# Patient Record
Sex: Male | Born: 1965 | Race: White | Hispanic: No | Marital: Married | Smoking: Never smoker
Health system: Southern US, Community
[De-identification: ages and names within clinical notes are randomized; demographics above are authoritative.]

## PROBLEM LIST (undated history)

## (undated) DIAGNOSIS — E785 Hyperlipidemia, unspecified: Secondary | ICD-10-CM

## (undated) HISTORY — PX: OTHER SURGICAL HISTORY: SHX169

## (undated) HISTORY — PX: CLOSED REDUCTION CLAVICLE FRACTURE: SUR253

---

## 2000-08-05 ENCOUNTER — Emergency Department (HOSPITAL_COMMUNITY): Admission: EM | Admit: 2000-08-05 | Discharge: 2000-08-05 | Payer: Self-pay | Admitting: Emergency Medicine

## 2000-08-05 ENCOUNTER — Encounter: Payer: Self-pay | Admitting: Orthopedic Surgery

## 2000-08-14 ENCOUNTER — Encounter: Payer: Self-pay | Admitting: Orthopedic Surgery

## 2000-08-14 ENCOUNTER — Ambulatory Visit (HOSPITAL_BASED_OUTPATIENT_CLINIC_OR_DEPARTMENT_OTHER): Admission: RE | Admit: 2000-08-14 | Discharge: 2000-08-14 | Payer: Self-pay | Admitting: Orthopedic Surgery

## 2014-10-12 ENCOUNTER — Ambulatory Visit (INDEPENDENT_AMBULATORY_CARE_PROVIDER_SITE_OTHER): Payer: 59 | Admitting: Podiatry

## 2014-10-12 ENCOUNTER — Ambulatory Visit (INDEPENDENT_AMBULATORY_CARE_PROVIDER_SITE_OTHER): Payer: 59

## 2014-10-12 VITALS — BP 148/100 | HR 61 | Resp 17

## 2014-10-12 DIAGNOSIS — R52 Pain, unspecified: Secondary | ICD-10-CM

## 2014-10-12 DIAGNOSIS — M722 Plantar fascial fibromatosis: Secondary | ICD-10-CM

## 2014-10-12 DIAGNOSIS — M779 Enthesopathy, unspecified: Secondary | ICD-10-CM

## 2014-10-12 MED ORDER — TRIAMCINOLONE ACETONIDE 10 MG/ML IJ SUSP
10.0000 mg | Freq: Once | INTRAMUSCULAR | Status: AC
  Administered 2014-10-12: 10 mg

## 2014-10-12 MED ORDER — MELOXICAM 15 MG PO TABS: 15.0000 mg | ORAL_TABLET | Freq: Every day | ORAL | Status: DC

## 2014-10-12 NOTE — Progress Notes (Signed)
Subjective:     Patient ID: Jeremy Winters, male   DOB: 1966/03/11, 48 y.o.   MRN: [id number]  HPI patient presents stating I'm very active and do [place] [facility] and then I am going: Skiing and I am having pain in my feet with the right one being quite a bit more inflamed and painful on the left one. Patient is very active   Review of Systems  All other systems reviewed and are negative.      Objective:   Physical Exam  Constitutional: He is oriented to person, place, and time.  Cardiovascular: Intact distal pulses.   Musculoskeletal: Normal range of motion.  Neurological: He is oriented to person, place, and time.  Skin: Skin is warm.  Nursing note and vitals reviewed.  neurovascular status is found to be intact with muscle strength adequate and range of motion subtalar and midtarsal joint within normal limits. Patient is noted to have equinus condition bilateral and is noted to have good digital perfusion and is well oriented 3. Patient does have a tight foot type and was noted to have discomfort in the medial arch right distal to the insertion into the calcaneus with mild discomfort also noted in the sinus tarsi when palpated     Assessment:     Probable low grade plantar fasciitis right causing compensatory symptoms in a very active individual    Plan:     H&P and x-rays reviewed and also discussed stretching exercises. Today I administered a very careful steroid injection into the mid arch area 3 mg dexamethasone Kenalog and 5 mg of Xylocaine. I did explain that this will put stress on the tendon and the possibility for rupture always exist and I want him to reduce activity and I also dispensed a fascially brace to wear. We will see him back again in approximately 4 weeks to see the results and how he did with his activities and he may need further treatment depending on how he responds. Also placed on mobility 15 mg daily

## 2014-10-12 NOTE — Progress Notes (Signed)
   Subjective:    Patient ID: Jeremy Winters, male    DOB: [birth year], 48 y.o.   MRN: [id number]  HPI  Pain in bilateral feet, worse in right foot. Burning/shooting pain, worsens with prolonged standing and walking  Review of Systems  All other systems reviewed and are negative.      Objective:   Physical Exam        Assessment & Plan:

## 2014-10-12 NOTE — Patient Instructions (Signed)

## 2014-11-09 ENCOUNTER — Ambulatory Visit (INDEPENDENT_AMBULATORY_CARE_PROVIDER_SITE_OTHER): Payer: 59 | Admitting: Podiatry

## 2014-11-09 VITALS — BP 126/88 | HR 66 | Resp 16

## 2014-11-09 DIAGNOSIS — M722 Plantar fascial fibromatosis: Secondary | ICD-10-CM

## 2014-11-09 NOTE — Progress Notes (Signed)
Subjective:     Patient ID: Jeremy Winters, male   DOB: 1965-12-28, 48 y.o.   MRN: [id number]  HPI patient presents stating I'm still having pain in my arch that doesn't seem to be as intense but it is more chronic in nature if I been very active. I was able to do a half marathon and do intense [place] skiing   Review of Systems     Objective:   Physical Exam Neurovascular status intact with moderate discomfort in the mid arch area right with no indications of fluid buildup or intense discomfort upon palpation    Assessment:     Chronic low grade plantar fasciitis right with an extremely active individual who develops symptoms secondary to his activity levels and activities he chooses to do    Plan:     Reviewed condition and at this time recommended night splint to try to help with stretch along with aggressive ice therapy and long-term orthotics. Patient is scanned for custom orthotics with instructions on their usage and will be seen back when they are ready and was given instructions on night splint usage and he will sleep with it at the current time

## 2014-12-04 ENCOUNTER — Ambulatory Visit: Payer: 59 | Admitting: *Deleted

## 2014-12-04 DIAGNOSIS — M722 Plantar fascial fibromatosis: Secondary | ICD-10-CM

## 2014-12-04 NOTE — Patient Instructions (Signed)

## 2014-12-04 NOTE — Progress Notes (Signed)
PICKING UP MY ORTHOTICS  

## 2015-01-04 ENCOUNTER — Ambulatory Visit: Payer: 59 | Admitting: Podiatry

## 2015-01-07 ENCOUNTER — Encounter: Payer: Self-pay | Admitting: Podiatry

## 2015-01-07 ENCOUNTER — Ambulatory Visit (INDEPENDENT_AMBULATORY_CARE_PROVIDER_SITE_OTHER): Payer: 59 | Admitting: Podiatry

## 2015-01-07 VITALS — BP 129/84 | HR 68 | Resp 16

## 2015-01-07 DIAGNOSIS — M722 Plantar fascial fibromatosis: Secondary | ICD-10-CM | POA: Diagnosis not present

## 2015-01-07 NOTE — Progress Notes (Signed)
Subjective:     Patient ID: Jeremy Winters, male   DOB: [birth year], 49 y.o.   MRN: [id number]  HPI patient states he still can get some pain in his arch right and heal but it is much less intense than previously and he is now running 50 miles a week and is due to run a marathon in the next 2 weeks. States that he does not know when it's can occur but at times it just appears whether running or doing nothing   Review of Systems     Objective:   Physical Exam Neurovascular status intact and was unable today to palpate any area of intense discomfort within the fascial band or fascially insertion right. There is no swelling or other area found that was abnormal    Assessment:     Low grade fasciitis with patient who is very active and may always have this kind of condition    Plan:     Reviewed aggressive physical therapy continued splinting activities and that ultimately this may require shockwave therapy. If symptoms were to get worse or other issues were to occur he is to reappoint for consideration of shockwave

## 2022-01-12 ENCOUNTER — Encounter (HOSPITAL_COMMUNITY): Payer: Self-pay

## 2022-01-12 ENCOUNTER — Inpatient Hospital Stay (HOSPITAL_COMMUNITY)
Admission: EM | Disposition: A | Discharge status: Home / Self Care | Payer: Self-pay | Source: Home / Self Care | Attending: Cardiothoracic Surgery

## 2022-01-12 ENCOUNTER — Emergency Department (HOSPITAL_COMMUNITY): Payer: 59

## 2022-01-12 ENCOUNTER — Other Ambulatory Visit: Payer: Self-pay

## 2022-01-12 ENCOUNTER — Inpatient Hospital Stay (HOSPITAL_COMMUNITY): Payer: 59

## 2022-01-12 ENCOUNTER — Inpatient Hospital Stay (HOSPITAL_COMMUNITY)
Admission: EM | Admit: 2022-01-12 | Discharge: 2022-01-26 | DRG: 233 | Disposition: A | Discharge status: Home / Self Care | Payer: 59 | Attending: Cardiothoracic Surgery | Admitting: Cardiothoracic Surgery

## 2022-01-12 DIAGNOSIS — Z01810 Encounter for preprocedural cardiovascular examination: Secondary | ICD-10-CM | POA: Diagnosis not present

## 2022-01-12 DIAGNOSIS — E8721 Acute metabolic acidosis: Secondary | ICD-10-CM | POA: Diagnosis present

## 2022-01-12 DIAGNOSIS — N289 Disorder of kidney and ureter, unspecified: Secondary | ICD-10-CM | POA: Diagnosis not present

## 2022-01-12 DIAGNOSIS — I493 Ventricular premature depolarization: Secondary | ICD-10-CM | POA: Diagnosis present

## 2022-01-12 DIAGNOSIS — J9811 Atelectasis: Secondary | ICD-10-CM | POA: Diagnosis present

## 2022-01-12 DIAGNOSIS — E871 Hypo-osmolality and hyponatremia: Secondary | ICD-10-CM | POA: Diagnosis not present

## 2022-01-12 DIAGNOSIS — G9340 Encephalopathy, unspecified: Secondary | ICD-10-CM | POA: Diagnosis present

## 2022-01-12 DIAGNOSIS — R079 Chest pain, unspecified: Secondary | ICD-10-CM

## 2022-01-12 DIAGNOSIS — Z978 Presence of other specified devices: Secondary | ICD-10-CM

## 2022-01-12 DIAGNOSIS — J9601 Acute respiratory failure with hypoxia: Secondary | ICD-10-CM | POA: Diagnosis present

## 2022-01-12 DIAGNOSIS — I2510 Atherosclerotic heart disease of native coronary artery without angina pectoris: Secondary | ICD-10-CM

## 2022-01-12 DIAGNOSIS — R4189 Other symptoms and signs involving cognitive functions and awareness: Secondary | ICD-10-CM

## 2022-01-12 DIAGNOSIS — I469 Cardiac arrest, cause unspecified: Secondary | ICD-10-CM

## 2022-01-12 DIAGNOSIS — J939 Pneumothorax, unspecified: Secondary | ICD-10-CM

## 2022-01-12 DIAGNOSIS — Z951 Presence of aortocoronary bypass graft: Secondary | ICD-10-CM

## 2022-01-12 DIAGNOSIS — E7800 Pure hypercholesterolemia, unspecified: Secondary | ICD-10-CM | POA: Diagnosis present

## 2022-01-12 DIAGNOSIS — R570 Cardiogenic shock: Secondary | ICD-10-CM | POA: Diagnosis present

## 2022-01-12 DIAGNOSIS — Z09 Encounter for follow-up examination after completed treatment for conditions other than malignant neoplasm: Secondary | ICD-10-CM

## 2022-01-12 DIAGNOSIS — D62 Acute posthemorrhagic anemia: Secondary | ICD-10-CM | POA: Diagnosis not present

## 2022-01-12 DIAGNOSIS — D72829 Elevated white blood cell count, unspecified: Secondary | ICD-10-CM | POA: Diagnosis present

## 2022-01-12 DIAGNOSIS — Z20822 Contact with and (suspected) exposure to covid-19: Secondary | ICD-10-CM | POA: Diagnosis present

## 2022-01-12 DIAGNOSIS — R451 Restlessness and agitation: Secondary | ICD-10-CM | POA: Diagnosis present

## 2022-01-12 DIAGNOSIS — J90 Pleural effusion, not elsewhere classified: Secondary | ICD-10-CM | POA: Diagnosis present

## 2022-01-12 DIAGNOSIS — E663 Overweight: Secondary | ICD-10-CM | POA: Diagnosis present

## 2022-01-12 DIAGNOSIS — Z9889 Other specified postprocedural states: Secondary | ICD-10-CM

## 2022-01-12 DIAGNOSIS — I214 Non-ST elevation (NSTEMI) myocardial infarction: Secondary | ICD-10-CM | POA: Diagnosis present

## 2022-01-12 DIAGNOSIS — I4901 Ventricular fibrillation: Secondary | ICD-10-CM | POA: Diagnosis present

## 2022-01-12 DIAGNOSIS — I252 Old myocardial infarction: Secondary | ICD-10-CM | POA: Diagnosis not present

## 2022-01-12 DIAGNOSIS — E785 Hyperlipidemia, unspecified: Secondary | ICD-10-CM

## 2022-01-12 DIAGNOSIS — E8770 Fluid overload, unspecified: Secondary | ICD-10-CM | POA: Diagnosis present

## 2022-01-12 DIAGNOSIS — I472 Ventricular tachycardia, unspecified: Secondary | ICD-10-CM | POA: Diagnosis present

## 2022-01-12 DIAGNOSIS — R739 Hyperglycemia, unspecified: Secondary | ICD-10-CM | POA: Diagnosis present

## 2022-01-12 DIAGNOSIS — N179 Acute kidney failure, unspecified: Secondary | ICD-10-CM | POA: Diagnosis present

## 2022-01-12 DIAGNOSIS — E876 Hypokalemia: Secondary | ICD-10-CM | POA: Diagnosis present

## 2022-01-12 DIAGNOSIS — D696 Thrombocytopenia, unspecified: Secondary | ICD-10-CM | POA: Diagnosis not present

## 2022-01-12 DIAGNOSIS — I10 Essential (primary) hypertension: Secondary | ICD-10-CM | POA: Diagnosis present

## 2022-01-12 DIAGNOSIS — Z8249 Family history of ischemic heart disease and other diseases of the circulatory system: Secondary | ICD-10-CM

## 2022-01-12 DIAGNOSIS — G4700 Insomnia, unspecified: Secondary | ICD-10-CM | POA: Diagnosis present

## 2022-01-12 DIAGNOSIS — I462 Cardiac arrest due to underlying cardiac condition: Secondary | ICD-10-CM | POA: Diagnosis present

## 2022-01-12 DIAGNOSIS — Z6828 Body mass index (BMI) 28.0-28.9, adult: Secondary | ICD-10-CM

## 2022-01-12 DIAGNOSIS — D649 Anemia, unspecified: Secondary | ICD-10-CM

## 2022-01-12 DIAGNOSIS — R7989 Other specified abnormal findings of blood chemistry: Secondary | ICD-10-CM

## 2022-01-12 DIAGNOSIS — F419 Anxiety disorder, unspecified: Secondary | ICD-10-CM | POA: Diagnosis present

## 2022-01-12 HISTORY — DX: Hyperlipidemia, unspecified: E78.5

## 2022-01-12 HISTORY — PX: RIGHT/LEFT HEART CATH AND CORONARY ANGIOGRAPHY: CATH118266

## 2022-01-12 LAB — POCT I-STAT, CHEM 8
BUN: 25 mg/dL — ABNORMAL HIGH (ref 6–20)
BUN: 25 mg/dL — ABNORMAL HIGH (ref 6–20)
Calcium, Ion: 1.14 mmol/L — ABNORMAL LOW (ref 1.15–1.40)
Calcium, Ion: 1.15 mmol/L (ref 1.15–1.40)
Chloride: 104 mmol/L (ref 98–111)
Chloride: 102 mmol/L (ref 98–111)
Creatinine, Ser: 1.7 mg/dL — ABNORMAL HIGH (ref 0.61–1.24)
Creatinine, Ser: 1.2 mg/dL (ref 0.61–1.24)
Glucose, Bld: 240 mg/dL — ABNORMAL HIGH (ref 70–99)
Glucose, Bld: 226 mg/dL — ABNORMAL HIGH (ref 70–99)
HCT: 46 % (ref 39.0–52.0)
HCT: 45 % (ref 39.0–52.0)
Hemoglobin: 15.6 g/dL (ref 13.0–17.0)
Hemoglobin: 15.3 g/dL (ref 13.0–17.0)
Potassium: 3.8 mmol/L (ref 3.5–5.1)
Potassium: 3.4 mmol/L — ABNORMAL LOW (ref 3.5–5.1)
Sodium: 139 mmol/L (ref 135–145)
Sodium: 139 mmol/L (ref 135–145)
TCO2: 22 mmol/L (ref 22–32)
TCO2: 26 mmol/L (ref 22–32)

## 2022-01-12 LAB — CBC
HCT: 45 % (ref 39.0–52.0)
Hemoglobin: 15.8 g/dL (ref 13.0–17.0)
MCH: 30.7 pg (ref 26.0–34.0)
MCHC: 35.1 g/dL (ref 30.0–36.0)
MCV: 87.4 fL (ref 80.0–100.0)
Platelets: 248 10*3/uL (ref 150–400)
RBC: 5.15 MIL/uL (ref 4.22–5.81)
RDW: 12.8 % (ref 11.5–15.5)
WBC: 11.5 10*3/uL — ABNORMAL HIGH (ref 4.0–10.5)
nRBC: 0 % (ref 0.0–0.2)

## 2022-01-12 LAB — COMPREHENSIVE METABOLIC PANEL
ALT: 152 U/L — ABNORMAL HIGH (ref 0–44)
AST: 143 U/L — ABNORMAL HIGH (ref 15–41)
Albumin: 3.8 g/dL (ref 3.5–5.0)
Alkaline Phosphatase: 56 U/L (ref 38–126)
Anion gap: 18 — ABNORMAL HIGH (ref 5–15)
BUN: 21 mg/dL — ABNORMAL HIGH (ref 6–20)
CO2: 19 mmol/L — ABNORMAL LOW (ref 22–32)
Calcium: 8.4 mg/dL — ABNORMAL LOW (ref 8.9–10.3)
Chloride: 101 mmol/L (ref 98–111)
Creatinine, Ser: 1.64 mg/dL — ABNORMAL HIGH (ref 0.61–1.24)
GFR, Estimated: 49 mL/min — ABNORMAL LOW (ref >60)
Glucose, Bld: 238 mg/dL — ABNORMAL HIGH (ref 70–99)
Potassium: 3.8 mmol/L (ref 3.5–5.1)
Sodium: 138 mmol/L (ref 135–145)
Total Bilirubin: 0.7 mg/dL (ref 0.3–1.2)
Total Protein: 6.9 g/dL (ref 6.5–8.1)

## 2022-01-12 LAB — URINALYSIS, ROUTINE W REFLEX MICROSCOPIC
Bacteria, UA: NONE SEEN
Bilirubin Urine: NEGATIVE
Glucose, UA: 50 mg/dL — AB
Ketones, ur: NEGATIVE mg/dL
Leukocytes,Ua: NEGATIVE
Nitrite: NEGATIVE
Protein, ur: NEGATIVE mg/dL
Specific Gravity, Urine: >1.046 — ABNORMAL HIGH (ref 1.005–1.030)
pH: 5 (ref 5.0–8.0)

## 2022-01-12 LAB — POCT I-STAT EG7
Acid-base deficit: 2 mmol/L (ref 0.0–2.0)
Bicarbonate: 26 mmol/L (ref 20.0–28.0)
Calcium, Ion: 1.14 mmol/L — ABNORMAL LOW (ref 1.15–1.40)
HCT: 44 % (ref 39.0–52.0)
Hemoglobin: 15 g/dL (ref 13.0–17.0)
O2 Saturation: 62 %
Potassium: 3.4 mmol/L — ABNORMAL LOW (ref 3.5–5.1)
Sodium: 140 mmol/L (ref 135–145)
TCO2: 28 mmol/L (ref 22–32)
pCO2, Ven: 56.8 mmHg (ref 44–60)
pH, Ven: 7.268 (ref 7.25–7.43)
pO2, Ven: 37 mmHg (ref 32–45)

## 2022-01-12 LAB — POCT I-STAT 7, (LYTES, BLD GAS, ICA,H+H)
Acid-base deficit: 3 mmol/L — ABNORMAL HIGH (ref 0.0–2.0)
Bicarbonate: 23.2 mmol/L (ref 20.0–28.0)
Calcium, Ion: 1.08 mmol/L — ABNORMAL LOW (ref 1.15–1.40)
HCT: 44 % (ref 39.0–52.0)
Hemoglobin: 15 g/dL (ref 13.0–17.0)
O2 Saturation: 100 %
Potassium: 6.3 mmol/L (ref 3.5–5.1)
Sodium: 136 mmol/L (ref 135–145)
TCO2: 25 mmol/L (ref 22–32)
pCO2 arterial: 45.2 mmHg (ref 32–48)
pH, Arterial: 7.319 — ABNORMAL LOW (ref 7.35–7.45)
pO2, Arterial: 406 mmHg — ABNORMAL HIGH (ref 83–108)

## 2022-01-12 LAB — POTASSIUM: Potassium: 6.6 mmol/L (ref 3.5–5.1)

## 2022-01-12 LAB — BASIC METABOLIC PANEL
Anion gap: 8 (ref 5–15)
BUN: 25 mg/dL — ABNORMAL HIGH (ref 6–20)
CO2: 22 mmol/L (ref 22–32)
Calcium: 7.3 mg/dL — ABNORMAL LOW (ref 8.9–10.3)
Chloride: 105 mmol/L (ref 98–111)
Creatinine, Ser: 1.37 mg/dL — ABNORMAL HIGH (ref 0.61–1.24)
GFR, Estimated: >60 mL/min (ref >60)
Glucose, Bld: 167 mg/dL — ABNORMAL HIGH (ref 70–99)
Potassium: 5.4 mmol/L — ABNORMAL HIGH (ref 3.5–5.1)
Sodium: 135 mmol/L (ref 135–145)

## 2022-01-12 LAB — RESP PANEL BY RT-PCR (FLU A&B, COVID) ARPGX2
Influenza A by PCR: NEGATIVE
Influenza B by PCR: NEGATIVE
SARS Coronavirus 2 by RT PCR: NEGATIVE

## 2022-01-12 LAB — TRIGLYCERIDES: Triglycerides: 534 mg/dL — ABNORMAL HIGH (ref <150)

## 2022-01-12 LAB — LACTIC ACID, PLASMA
Lactic Acid, Venous: 7.8 mmol/L (ref 0.5–1.9)
Lactic Acid, Venous: 1.6 mmol/L (ref 0.5–1.9)

## 2022-01-12 LAB — RAPID URINE DRUG SCREEN, HOSP PERFORMED
Amphetamines: NOT DETECTED
Barbiturates: NOT DETECTED
Benzodiazepines: POSITIVE — AB
Cocaine: NOT DETECTED
Opiates: NOT DETECTED
Tetrahydrocannabinol: NOT DETECTED

## 2022-01-12 LAB — PHOSPHORUS: Phosphorus: 6.9 mg/dL — ABNORMAL HIGH (ref 2.5–4.6)

## 2022-01-12 LAB — TROPONIN I (HIGH SENSITIVITY)
Troponin I (High Sensitivity): 49 ng/L — ABNORMAL HIGH (ref <18)
Troponin I (High Sensitivity): 6634 ng/L (ref <18)

## 2022-01-12 LAB — GLUCOSE, CAPILLARY: Glucose-Capillary: 221 mg/dL — ABNORMAL HIGH (ref 70–99)

## 2022-01-12 LAB — TYPE AND SCREEN
ABO/RH(D): A NEG
Antibody Screen: NEGATIVE

## 2022-01-12 LAB — MAGNESIUM
Magnesium: 2.3 mg/dL (ref 1.7–2.4)
Magnesium: 2.2 mg/dL (ref 1.7–2.4)

## 2022-01-12 LAB — ABO/RH: ABO/RH(D): A NEG

## 2022-01-12 SURGERY — RIGHT/LEFT HEART CATH AND CORONARY ANGIOGRAPHY
Anesthesia: LOCAL

## 2022-01-12 MED ORDER — LIDOCAINE HCL (PF) 1 % IJ SOLN
INTRAMUSCULAR | Status: DC | PRN
  Administered 2022-01-12: 2 mL
  Administered 2022-01-12: 5 mL

## 2022-01-12 MED ORDER — AMIODARONE HCL IN DEXTROSE 360-4.14 MG/200ML-% IV SOLN
60.0000 mg/h | INTRAVENOUS | Status: AC
  Administered 2022-01-12 (×3): 60 mg/h via INTRAVENOUS
  Filled 2022-01-12: qty 200

## 2022-01-12 MED ORDER — SODIUM ZIRCONIUM CYCLOSILICATE 10 G PO PACK
10.0000 g | PACK | Freq: Once | ORAL | Status: AC
  Administered 2022-01-12: 10 g
  Filled 2022-01-12: qty 1

## 2022-01-12 MED ORDER — PROPOFOL 1000 MG/100ML IV EMUL
0.0000 ug/kg/min | INTRAVENOUS | Status: DC
  Administered 2022-01-12: 30 ug/kg/min via INTRAVENOUS
  Administered 2022-01-13: 45 ug/kg/min via INTRAVENOUS
  Filled 2022-01-12 (×2): qty 100

## 2022-01-12 MED ORDER — NOREPINEPHRINE 4 MG/250ML-% IV SOLN
0.0000 ug/min | INTRAVENOUS | Status: DC
  Administered 2022-01-12: 2 ug/min via INTRAVENOUS
  Administered 2022-01-13: 8 ug/min via INTRAVENOUS
  Filled 2022-01-12 (×2): qty 250

## 2022-01-12 MED ORDER — HEPARIN (PORCINE) IN NACL 1000-0.9 UT/500ML-% IV SOLN
INTRAVENOUS | Status: AC
  Filled 2022-01-12: qty 1000

## 2022-01-12 MED ORDER — CALCIUM GLUCONATE-NACL 1-0.675 GM/50ML-% IV SOLN
1.0000 g | Freq: Once | INTRAVENOUS | Status: AC
  Administered 2022-01-12: 1000 mg via INTRAVENOUS
  Filled 2022-01-12: qty 50

## 2022-01-12 MED ORDER — SODIUM CHLORIDE 0.9% FLUSH: 3.0000 mL | INTRAVENOUS | Status: DC | PRN

## 2022-01-12 MED ORDER — INSULIN ASPART 100 UNIT/ML IJ SOLN
0.0000 [IU] | INTRAMUSCULAR | Status: DC
  Administered 2022-01-12: 3 [IU] via SUBCUTANEOUS
  Administered 2022-01-13 (×3): 1 [IU] via SUBCUTANEOUS
  Administered 2022-01-13: 2 [IU] via SUBCUTANEOUS
  Administered 2022-01-13 – 2022-01-14 (×5): 1 [IU] via SUBCUTANEOUS

## 2022-01-12 MED ORDER — DEXTROSE 5 % IV SOLN
INTRAVENOUS | Status: DC | PRN
  Administered 2022-01-12: 150 mg via INTRAVENOUS

## 2022-01-12 MED ORDER — FENTANYL CITRATE PF 50 MCG/ML IJ SOSY
PREFILLED_SYRINGE | INTRAMUSCULAR | Status: AC
  Filled 2022-01-12: qty 1

## 2022-01-12 MED ORDER — NITROGLYCERIN IN D5W 200-5 MCG/ML-% IV SOLN
INTRAVENOUS | Status: DC | PRN
Start: 2022-01-12 — End: 2022-01-12
  Administered 2022-01-12: 10 ug/min via INTRAVENOUS

## 2022-01-12 MED ORDER — FENTANYL CITRATE PF 50 MCG/ML IJ SOSY: 50.0000 ug | PREFILLED_SYRINGE | Freq: Once | INTRAMUSCULAR | Status: DC

## 2022-01-12 MED ORDER — CHLORHEXIDINE GLUCONATE 0.12% ORAL RINSE (MEDLINE KIT)
15.0000 mL | Freq: Two times a day (BID) | OROMUCOSAL | Status: DC
  Administered 2022-01-12 – 2022-01-17 (×6): 15 mL via OROMUCOSAL

## 2022-01-12 MED ORDER — SODIUM CHLORIDE 0.9 % IV SOLN
INTRAVENOUS | Status: AC | PRN
Start: 2022-01-12 — End: 2022-01-12
  Administered 2022-01-12: 500 mL via INTRAVENOUS

## 2022-01-12 MED ORDER — VERAPAMIL HCL 2.5 MG/ML IV SOLN
INTRAVENOUS | Status: AC
  Filled 2022-01-12: qty 2

## 2022-01-12 MED ORDER — SODIUM CHLORIDE 0.9% FLUSH
3.0000 mL | Freq: Two times a day (BID) | INTRAVENOUS | Status: DC
  Administered 2022-01-13 – 2022-01-16 (×6): 3 mL via INTRAVENOUS

## 2022-01-12 MED ORDER — SODIUM CHLORIDE 0.9 % IV SOLN: 250.0000 mL | INTRAVENOUS | Status: DC | PRN

## 2022-01-12 MED ORDER — PANTOPRAZOLE SODIUM 40 MG IV SOLR
40.0000 mg | Freq: Every day | INTRAVENOUS | Status: DC
  Administered 2022-01-12: 40 mg via INTRAVENOUS
  Filled 2022-01-12: qty 10

## 2022-01-12 MED ORDER — ASPIRIN 81 MG PO CHEW: 81.0000 mg | CHEWABLE_TABLET | Freq: Every day | ORAL | Status: DC

## 2022-01-12 MED ORDER — PROPOFOL 1000 MG/100ML IV EMUL
INTRAVENOUS | Status: AC
  Administered 2022-01-12: 20 ug/kg/min via INTRAVENOUS
  Filled 2022-01-12: qty 100

## 2022-01-12 MED ORDER — SODIUM CHLORIDE 0.9 % WEIGHT BASED INFUSION
1.0000 mL/kg/h | INTRAVENOUS | Status: AC
  Administered 2022-01-12: 1 mL/kg/h via INTRAVENOUS

## 2022-01-12 MED ORDER — MIDAZOLAM HCL 2 MG/2ML IJ SOLN
INTRAMUSCULAR | Status: AC
  Filled 2022-01-12: qty 2

## 2022-01-12 MED ORDER — FENTANYL 2500MCG IN NS 250ML (10MCG/ML) PREMIX INFUSION
25.0000 ug/h | INTRAVENOUS | Status: DC
  Administered 2022-01-12: 150 ug/h via INTRAVENOUS
  Administered 2022-01-13: 200 ug/h via INTRAVENOUS
  Filled 2022-01-12: qty 250

## 2022-01-12 MED ORDER — LIDOCAINE HCL (PF) 1 % IJ SOLN
INTRAMUSCULAR | Status: AC
  Filled 2022-01-12: qty 30

## 2022-01-12 MED ORDER — ETOMIDATE 2 MG/ML IV SOLN
INTRAVENOUS | Status: DC | PRN
Start: 2022-01-12 — End: 2022-01-13
  Administered 2022-01-12: 30 mg via INTRAVENOUS

## 2022-01-12 MED ORDER — POLYETHYLENE GLYCOL 3350 17 G PO PACK
17.0000 g | PACK | Freq: Every day | ORAL | Status: DC
  Administered 2022-01-12: 17 g
  Filled 2022-01-12: qty 1

## 2022-01-12 MED ORDER — PROPOFOL 1000 MG/100ML IV EMUL: 0.0000 ug/kg/min | INTRAVENOUS | Status: DC

## 2022-01-12 MED ORDER — DOCUSATE SODIUM 50 MG/5ML PO LIQD
100.0000 mg | Freq: Two times a day (BID) | ORAL | Status: DC
  Administered 2022-01-12: 100 mg
  Filled 2022-01-12: qty 10

## 2022-01-12 MED ORDER — HEPARIN SODIUM (PORCINE) 1000 UNIT/ML IJ SOLN
INTRAMUSCULAR | Status: DC | PRN
  Administered 2022-01-12: 4000 [IU] via INTRAVENOUS

## 2022-01-12 MED ORDER — ACETAMINOPHEN 325 MG PO TABS: 650.0000 mg | ORAL_TABLET | ORAL | Status: DC | PRN

## 2022-01-12 MED ORDER — INSULIN ASPART 100 UNIT/ML IV SOLN
10.0000 [IU] | Freq: Once | INTRAVENOUS | Status: AC
  Administered 2022-01-12: 10 [IU] via INTRAVENOUS

## 2022-01-12 MED ORDER — FENTANYL 2500MCG IN NS 250ML (10MCG/ML) PREMIX INFUSION
INTRAVENOUS | Status: AC
  Filled 2022-01-12: qty 250

## 2022-01-12 MED ORDER — HEPARIN (PORCINE) 25000 UT/250ML-% IV SOLN
1950.0000 [IU]/h | INTRAVENOUS | Status: DC
  Administered 2022-01-13: 1100 [IU]/h via INTRAVENOUS
  Administered 2022-01-13: 17:00:00 1300 [IU]/h via INTRAVENOUS
  Administered 2022-01-14: 1500 [IU]/h via INTRAVENOUS
  Administered 2022-01-15 (×2): 1800 [IU]/h via INTRAVENOUS
  Administered 2022-01-16: 1950 [IU]/h via INTRAVENOUS
  Administered 2022-01-16: 1800 [IU]/h via INTRAVENOUS
  Administered 2022-01-17 (×2): 1950 [IU]/h via INTRAVENOUS
  Filled 2022-01-12 (×9): qty 250

## 2022-01-12 MED ORDER — ASPIRIN 300 MG RE SUPP
RECTAL | Status: DC | PRN
  Administered 2022-01-12: 300 mg via RECTAL

## 2022-01-12 MED ORDER — METOPROLOL TARTRATE 5 MG/5ML IV SOLN
INTRAVENOUS | Status: AC
  Filled 2022-01-12: qty 5

## 2022-01-12 MED ORDER — NITROGLYCERIN 1 MG/10 ML FOR IR/CATH LAB
INTRA_ARTERIAL | Status: AC
  Filled 2022-01-12: qty 10

## 2022-01-12 MED ORDER — ONDANSETRON HCL 4 MG/2ML IJ SOLN
4.0000 mg | Freq: Four times a day (QID) | INTRAMUSCULAR | Status: DC | PRN
  Administered 2022-01-13: 4 mg via INTRAVENOUS
  Filled 2022-01-12: qty 2

## 2022-01-12 MED ORDER — PANTOPRAZOLE 2 MG/ML SUSPENSION
40.0000 mg | Freq: Every day | ORAL | Status: DC
  Filled 2022-01-12: qty 20

## 2022-01-12 MED ORDER — ROCURONIUM BROMIDE 50 MG/5ML IV SOLN
INTRAVENOUS | Status: DC | PRN
  Administered 2022-01-12: 100 mg via INTRAVENOUS

## 2022-01-12 MED ORDER — LACTATED RINGERS IV SOLN: INTRAVENOUS | Status: DC

## 2022-01-12 MED ORDER — ASPIRIN 300 MG RE SUPP
300.0000 mg | Freq: Once | RECTAL | Status: AC
  Filled 2022-01-12: qty 1

## 2022-01-12 MED ORDER — AMIODARONE IV BOLUS ONLY 150 MG/100ML: 150.0000 mg | Freq: Once | INTRAVENOUS | Status: DC

## 2022-01-12 MED ORDER — DEXTROSE 50 % IV SOLN
1.0000 | Freq: Once | INTRAVENOUS | Status: AC
  Administered 2022-01-12: 50 mL via INTRAVENOUS
  Filled 2022-01-12: qty 50

## 2022-01-12 MED ORDER — PROPOFOL 1000 MG/100ML IV EMUL
INTRAVENOUS | Status: DC | PRN
  Administered 2022-01-12 (×2): 20 mg via INTRAVENOUS
  Administered 2022-01-12: 10 ug/kg/min via INTRAVENOUS
  Administered 2022-01-12 (×2): 20 mg via INTRAVENOUS

## 2022-01-12 MED ORDER — AMIODARONE HCL IN DEXTROSE 360-4.14 MG/200ML-% IV SOLN
30.0000 mg/h | INTRAVENOUS | Status: DC
  Administered 2022-01-13 (×2): 30 mg/h via INTRAVENOUS
  Filled 2022-01-12 (×2): qty 200

## 2022-01-12 MED ORDER — DOBUTAMINE IN D5W 4-5 MG/ML-% IV SOLN
2.5000 ug/kg/min | INTRAVENOUS | Status: DC
  Administered 2022-01-12: 2.5 ug/kg/min via INTRAVENOUS
  Filled 2022-01-12: qty 250

## 2022-01-12 MED ORDER — FENTANYL CITRATE (PF) 100 MCG/2ML IJ SOLN
INTRAMUSCULAR | Status: DC | PRN
  Administered 2022-01-12: 50 ug via INTRAVENOUS

## 2022-01-12 MED ORDER — ACETAMINOPHEN 325 MG PO TABS: 650.0000 mg | ORAL_TABLET | Freq: Four times a day (QID) | ORAL | Status: DC | PRN

## 2022-01-12 MED ORDER — MIDAZOLAM HCL 2 MG/2ML IJ SOLN
INTRAMUSCULAR | Status: DC | PRN
  Administered 2022-01-12 (×2): 2 mg via INTRAVENOUS

## 2022-01-12 MED ORDER — ATORVASTATIN CALCIUM 80 MG PO TABS
80.0000 mg | ORAL_TABLET | Freq: Every day | ORAL | Status: DC
  Administered 2022-01-12: 80 mg
  Filled 2022-01-12: qty 1

## 2022-01-12 MED ORDER — FENTANYL CITRATE PF 50 MCG/ML IJ SOSY: 100.0000 ug | PREFILLED_SYRINGE | INTRAMUSCULAR | Status: DC | PRN

## 2022-01-12 MED ORDER — FENTANYL CITRATE PF 50 MCG/ML IJ SOSY
100.0000 ug | PREFILLED_SYRINGE | INTRAMUSCULAR | Status: DC | PRN
  Administered 2022-01-13: 100 ug via INTRAVENOUS
  Filled 2022-01-12: qty 2

## 2022-01-12 MED ORDER — IOHEXOL 350 MG/ML SOLN
INTRAVENOUS | Status: DC | PRN
  Administered 2022-01-12: 70 mL

## 2022-01-12 MED ORDER — HEPARIN SODIUM (PORCINE) 1000 UNIT/ML IJ SOLN
INTRAMUSCULAR | Status: AC
  Filled 2022-01-12: qty 10

## 2022-01-12 MED ORDER — FENTANYL BOLUS VIA INFUSION
50.0000 ug | INTRAVENOUS | Status: DC | PRN
  Filled 2022-01-12: qty 50

## 2022-01-12 MED ORDER — METOPROLOL TARTRATE 5 MG/5ML IV SOLN
INTRAVENOUS | Status: DC | PRN
  Administered 2022-01-12: 5 mg via INTRAVENOUS

## 2022-01-12 MED ORDER — SODIUM BICARBONATE 8.4 % IV SOLN
50.0000 meq | Freq: Once | INTRAVENOUS | Status: AC
  Administered 2022-01-12: 50 meq via INTRAVENOUS
  Filled 2022-01-12: qty 50

## 2022-01-12 MED ORDER — ORAL CARE MOUTH RINSE
15.0000 mL | OROMUCOSAL | Status: DC
  Administered 2022-01-12 – 2022-01-13 (×6): 15 mL via OROMUCOSAL

## 2022-01-12 SURGICAL SUPPLY — 15 items
CATH 5FR JL3.5 JR4 ANG PIG MP (CATHETERS) ×1 IMPLANT
CATH LAUNCHER 6FR EBU3.5 (CATHETERS) ×1 IMPLANT
CATH S G BIP PACING (CATHETERS) ×1 IMPLANT
DEVICE RAD COMP TR BAND LRG (VASCULAR PRODUCTS) ×1 IMPLANT
GLIDESHEATH SLEND SS 6F .021 (SHEATH) ×1 IMPLANT
GUIDEWIRE INQWIRE 1.5J.035X260 (WIRE) IMPLANT
INQWIRE 1.5J .035X260CM (WIRE) ×2
KIT ENCORE 26 ADVANTAGE (KITS) ×1 IMPLANT
KIT HEART LEFT (KITS) ×2 IMPLANT
KIT MICROPUNCTURE NIT STIFF (SHEATH) ×1 IMPLANT
PACK CARDIAC CATHETERIZATION (CUSTOM PROCEDURE TRAY) ×2 IMPLANT
SHEATH PINNACLE 7F 10CM (SHEATH) ×1 IMPLANT
SLEEVE REPOSITIONING LENGTH 30 (MISCELLANEOUS) ×1 IMPLANT
TRANSDUCER W/STOPCOCK (MISCELLANEOUS) ×2 IMPLANT
TUBING CIL FLEX 10 FLL-RA (TUBING) ×2 IMPLANT

## 2022-01-12 NOTE — H&P (Addendum)
NAME:  Jeremy Winters, MRN:  [id number], DOB:  August 29, 1966, LOS: 0 ADMISSION DATE:  01/12/2022, CONSULTATION DATE:  01/12/2022 REFERRING MD:  Cardiology, CHIEF COMPLAINT:  Cardiac Arrest   History of Present Illness:  Patient is currently intubated and sedated on mechanical ventilation, therefore history obtained from medical chart review.   56 year old male with prior history of HTN and HLD who was exercising at [employer] with witnessed collapse.  Patient did not receive CPR for 5 minutes prior to EMS arrival.  Found in pulseless Vtach.  Received ACLS measures, including epi x2, and defibrillations for vtach vs vib x 3 prior to [place] after 18 minutes with [facility] airway placed.   Per triage note, EMS had reported some purposeful movement en route and was given versed for agitation.    On arrival to ER, patient intubated.  EKG showed STE in V1 and V2 with reciprocal changes in lateral and inferior leads.  Given amio bolus and started on amio gtt.  No family able to be contacted thus far.  Taken emergently to cath lab.  PCCM consulted for admission after cath lab.    Labs: K 3.8, glucose 240, sCr 1.70, iCa 1.14, WBC 11.5.  Remaining labs/ SARS/ flu pending  Pertinent  Medical History  HTN, HLD  Significant Hospital Events: Including procedures, antibiotic start and stop dates in addition to other pertinent events   2/23 admitted after vfib cardiac arrest, ROSC after 18 mins.  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  R IJ PA cath placed.  Objective   Blood pressure (!) 117/96, pulse 80, resp. rate 18, height 5\' 9"  (1.753 m), weight 80 kg, SpO2 97 %.       No intake or output data in the 24 hours ending 01/12/22 1736 Filed Weights   01/12/22 1733  Weight: 80 kg    Examination: General:  critically ill adult male agitated trying to sit up on cath lab table HEENT: MM pink/moist/ ETT 24 at tube, OGT,  pupils 5/reactive Neuro:  agitated, trying to sit up, not f/c or opening eye CV: rr, ST, no murmur, TR band right radial PULM:  MV supported breaths, breathing over at times, diffuse rhonchi GI: soft, bs+ Extremities: warm/dry, no LE edema  Skin: no rashes   Resolved Hospital Problem list    Assessment & Plan:   Vfib/ Vtach cardiac arrest  STEMI - witnessed but no bystander CPR x 5 mins, ROSC after 73min, reported purposeful movement with EMS prior to versed Plan Per Cards.  S/p LHC> occluded L circumflex but good collaterals.  Further ischemic workup/ intervention pending neuro recovery Pending trop/ CMET/ coags/ Mag/ Phos/ lactic acid Echo Heparin per pharmacy  Continue amio.  Goal K > 4, Mag > 2 Follow cultures for completeness.  Hold on empiric abx  Acute respiratory failure in the setting of cardiac arrest Plan Full MV support, 4-8cc/kg IBW with goal Pplat <30 and DP<15  VAP prevention protocol/ PPI PAD protocol for sedation> propofol/ fentanyl for RASS goal 0/-1 w/ bowel regimen Wean FiO2 as able for SpO2 >92%  Daily SAT & SBT when appropriate  Send trach asp   Acute encephalopathy, concerning for anoxic but reportedly making purposeful movements with EMS Needs CTH (non-urgent- apparently did not hit head with LOC Pending UDS Serial neuro exams  Avoid fever- normothermia is goal  Defer neuro prognostication for 72 hours   AKI in  the setting of hypoperfusion f/b contrast  Plan MIVF at 75 ml/hr x 12 hour Foley Strict I/Os Trend BMP / urinary output Replace electrolytes as indicated Avoid nephrotoxic agents, ensure adequate renal perfusion   Hyperglycemia Plan  Check HA1C SSI sensitive/ CBG q4hr   Hx HTN Hx HLD - lipid panel - on no home meds   Best Practice (right click and "Reselect all SmartList Selections" daily)   Diet/type: NPO DVT prophylaxis: systemic heparin GI prophylaxis: PPI Lines: Central line Foley:  Yes, and it is still  needed Code Status:  full code Last date of multidisciplinary goals of care discussion [pending]  Family attempted to be contacted.  R[place] no answer.  Frederico Hamman notified and is coming from [place]  Lab 01/12/22 1653 01/12/22 1729  WBC 11.5*  --   HGB 15.8 15.6  HCT 45.0 46.0  MCV 87.4  --   PLT 248  --     Basic Metabolic Panel: Recent Labs  Lab 01/12/22 1729  NA 139  K 3.8  CL 104  GLUCOSE 240*  BUN 25*  CREATININE 1.70*   GFR: Estimated Creatinine Clearance: 49.7 mL/min (A) (by C-G formula based on SCr of 1.7 mg/dL (H)). Recent Labs  Lab 01/12/22 1653  WBC 11.5*    Liver Function Tests: No results for input(s): AST, ALT, ALKPHOS, BILITOT, PROT, ALBUMIN in the last 168 hours. No results for input(s): LIPASE, AMYLASE in the last 168 hours. No results for input(s): AMMONIA in the last 168 hours.  ABG    Component Value Date/Time   TCO2 22 01/12/2022 1729     Coagulation Profile: No results for input(s): INR, PROTIME in the last 168 hours.  Cardiac Enzymes: No results for input(s): CKTOTAL, CKMB, CKMBINDEX, TROPONINI in the last 168 hours.  HbA1C: No results found for: HGBA1C  CBG: No results for input(s): GLUCAP in the last 168 hours.  Review of Systems:   Unable as patient is sedated and intubated  Past Medical History:  unable  Surgical History:  unable  Social History:  unable  Family History:  His family history is not on file.   Allergies No Known Allergies   Home Medications  Prior to Admission medications   Medication Sig Start Date End Date Taking? Authorizing Provider  meloxicam (MOBIC) 15 MG tablet Take 1 tablet (15 mg total) by mouth daily. 10/12/14   Wallene Huh, DPM     Critical care time: 68 mins    Kennieth Rad, ACNP [facility] 01/12/2022, 6:59 PM  See Amion for pager If no response to pager, please call PCCM consult pager After 7:00  pm call Elink

## 2022-01-12 NOTE — Progress Notes (Signed)
Amiodarone Drug - Drug Interaction Consult Note  Recommendations:  N/A  Amiodarone is metabolized by the cytochrome P450 system and therefore has the potential to cause many drug interactions. Amiodarone has an average plasma half-life of 50 days (range 20 to 100 days).   There is potential for drug interactions to occur several weeks or months after stopping treatment and the onset of drug interactions may be slow after initiating amiodarone.   []  Statins: Increased risk of myopathy. Simvastatin- restrict dose to 20mg  daily. Other statins: counsel patients to report any muscle pain or weakness immediately.  []  Anticoagulants: Amiodarone can increase anticoagulant effect. Consider warfarin dose reduction. Patients should be monitored closely and the dose of anticoagulant altered accordingly, remembering that amiodarone levels take several weeks to stabilize.  []  Antiepileptics: Amiodarone can increase plasma concentration of phenytoin, the dose should be reduced. Note that small changes in phenytoin dose can result in large changes in levels. Monitor patient and counsel on signs of toxicity.  []  Beta blockers: increased risk of bradycardia, AV block and myocardial depression. Sotalol - avoid concomitant use.  []   Calcium channel blockers (diltiazem and verapamil): increased risk of bradycardia, AV block and myocardial depression.  []   Cyclosporine: Amiodarone increases levels of cyclosporine. Reduced dose of cyclosporine is recommended.  []  Digoxin dose should be halved when amiodarone is started.  []  Diuretics: increased risk of cardiotoxicity if hypokalemia occurs.  []  Oral hypoglycemic agents (glyburide, glipizide, glimepiride): increased risk of hypoglycemia. Patient's glucose levels should be monitored closely when initiating amiodarone therapy.   []  Drugs that prolong the QT interval:  Torsades de pointes risk may be increased with concurrent use - avoid if possible.  Monitor QTc,  also keep magnesium/potassium WNL if concurrent therapy can't be avoided.  Antibiotics: e.g. fluoroquinolones, erythromycin.  Antiarrhythmics: e.g. quinidine, procainamide, disopyramide, sotalol.  Antipsychotics: e.g. phenothiazines, haloperidol.   Lithium, tricyclic antidepressants, and methadone. Thank You,  D. , PharmD, BCPS, BCCCP 01/12/2022, 8:17 PM

## 2022-01-12 NOTE — Progress Notes (Addendum)
eLink Physician-Brief Progress Note Patient Name: Jeremy Winters DOB: May 03, 1966 MRN: [id number]   Date of Service  01/12/2022  HPI/Events of Note  Brief New admit note: 56 yo WM with witnessed cardiac arrest while walking on treadmill at [employer]. No bystander CPR or AED. EMS called and arrived quickly. CPR initiated and he was defibrillated. Initial Ecg showed RBBB with global ischemic changes and ST elevation in AVR and V1. On arrival to our facility was intubated and not responsive on sedation. He had multiple runs of NSVT. Started on IV amiodarone. Now in ICU post stat LHC-PCI.  Data: reviewed K 3.4, Glucose > 200 x 2 LA 7.8 CxR film seen: ET in place, left effusion vs atelectasis vs air space density.NG tube going below diaphragm, tip not seen. WBC 11.5, Covid/flu neg.   Camera evaluation done: HR 66, sats 97%, Temp 36.7, MAP 53. VT 13ml/ibw, PEEP 5, 100% fio2. PIP < 25 Levophed at 56mcg/min  A/P: STEMI-Out of hospital V tach/fib arrest, ROSC after 18 mins. LHC: complete occlusion of left circumflex with good collateral, may be from stress induced arrest from exercise at [employer].   Mild leukocytosis, elevated LA from above. No fever, not on antibiotics . Watch for any aspiration pneumonitis.   Low K. Replace if not done from ED.   - as per Cards note, no need for TTM, had purposeful movements post arrest and was agitated in LHC also. -continue amiodarone/heparin gtt. - on levophed, keep MAP > 65 - ECHO - follow Tox screening, CT head for any bleed or CVA   VTE: hep gtt.  CBG goals < 180on SSI.   Vent bundle: SAT-SBT daily when stable hemodynamics. Lung protective ventilation.    eICU Interventions  As above Discussed with RN.      Intervention Category Major Interventions: Other:;Respiratory failure - evaluation and management (Out of hospital Cardiac arrest with ROSC after 18 mins.) Evaluation Type: New Patient Evaluation  [name] 01/12/2022, 7:52  PM  20:35. Cardiac Index is reading 1.  Reading 1.65 in cath lab   Bedside is calling cardiology as well .  Discussed with RN. Cards at bed side, starting dobutamine gtt. Might end up for AIBP if not getting better. Cards to follow through also  20:42 K on ABG > 6.  Wean fio2 to 50% Get stat K/Mag level, call back if abnormal.  Discussed with RN  21:51 K back 6.4, Cr 1.2  Discussed with RN. - Hyperkalemia protocol ordered. - go for CT head post protocol. Going on heparin gtt for elevated troponin from mid night, if CT head negative for any bleed.  Follow K level.   01;24 Potassium at 4 Continue rising troponin to > 9000. EKG: junctional rhythm, inferior T down. LDL 100.  LA last 2 1.6 and 1.7. Cr  CT head still pending.  - continue care.

## 2022-01-12 NOTE — ED Triage Notes (Addendum)
Pt BIB [facility] s/p Cardiac arrrest. Pt was working out at [employer], had a witnessed arrest and collapsed. On EMS arrival, pt was noted to be in pulseless Vtach. EMS shocked @200  on their arrival, pt progressed to asystole. EMS applied [place] continued CPR. Pt rec'd total of 2 Epi by EMS, 3 shocks for ventricular arrythmias (Vtach/ Vfib vs torsades). Total of 18 minutes of CPR time. King airway dropped in transit. EMS reports some purposeful movement en route, 2.5 versed admin for some agitation. Pt arrives to tra B w/ pulses, sinus rhythm.

## 2022-01-12 NOTE — Progress Notes (Signed)
Pt transported on vent from TRB to Cath Lab without any complications. RN at bedside, RT will continue to monitor.

## 2022-01-12 NOTE — Code Documentation (Signed)
4000 UNITS OF HEPARIN GIVEN PER MD [place] ORDER

## 2022-01-12 NOTE — Progress Notes (Signed)
ANTICOAGULATION CONSULT NOTE - Initial Consult  Pharmacy Consult:  Heparin Indication: chest pain/ACS  No Known Allergies  Patient Measurements: Height: 5\' 9"  (175.3 cm) Weight: 90.7 kg (200 lb) IBW/kg (Calculated) : 70.7 Heparin Dosing Weight: 89 kg  Vital Signs: BP: 77/68 (02/23 1938) Pulse Rate: 65 (02/23 1938)  Labs: Recent Labs    01/12/22 1653 01/12/22 1729 01/12/22 1813 01/12/22 1816  HGB 15.8 15.6 15.0 15.3  HCT 45.0 46.0 44.0 45.0  PLT 248  --   --   --   CREATININE 1.64* 1.70*  --  1.20  TROPONINIHS 49*  --   --   --     Estimated Creatinine Clearance: 78.3 mL/min (by C-G formula based on SCr of 1.2 mg/dL).   Medical History: Past Medical History:  Diagnosis Date   Hyperlipidemia     Assessment: 75 YOM presented s/p cardiac arrest and underwent emergent cath where a [place] was placed.  Pharmacy consulted to dose IV heparin 8 hr post sheath removal.  Sheath removed at 1822 per Procedural Log.  Per family, patient is not on a blood thinner PTA.  Baseline labs reviewed.  Goal of Therapy:  Heparin level 0.3-0.7 units/ml Monitor platelets by anticoagulation protocol: Yes   Plan:  On 2/24 at 0230, start IV heparin at 1100 units/hr - no bolus post cath Check 6 hr heparin level Daily heparin level and CBC  Gricelda Foland D. Mina Marble, PharmD, BCPS, [place] 01/12/2022, 8:13 PM

## 2022-01-12 NOTE — Consult Note (Addendum)
Cardiology Consult Note:   Patient ID: Jeremy Winters MRN: [id number]; DOB: [birth year]   Admission date: 01/12/2022  PCP:  Patient, No Pcp Per (Inactive)   [facility] Providers Cardiologist:  None     Chief Complaint:  Cardiac Arrest   Patient Profile:   Jeremy Winters is a 56 y.o. male with no past medical history who is being seen 01/12/2022 for the evaluation of cardiac arrest  History of Present Illness:   [name] is a 56 year old male with above medical history.  Per chart review, patient does not have significant cardiac history.   Patient was working out at [employer] on 01/12/22 when he had a witnessed arrest and collapsed. Patient did not receive CPR for 5 minutes prior to EMS arrial. EMS found patient in pulseless vtach and shocked the patient @ 200. Pt progressed to asystole. Received 2 doses of EPI from EMS, 3 shocks for ventricular arrythmias (vtach, Vfib vs torsades). Total 18 minutes CPR. King airway dropped in transit. [place] airway placed in transit. Intubated upon arrival to ED. Continued to have v-tach while in the ED and given amiodarone.   EKG in the ED showed sinus rhythm, ST elevation in V1 and V2 with reciprocal changes.   Attempted to contact [employer] emergency contact: Sol Blazing [phone]. Reported patient was not taking any medications daily, no cardiac history. Cousin died of an MI. Patient does not smoke, drinks alcohol socially, no illicit drug use. Has not seen PCP for several years. Was very active and ran 50 mils per week.   Also tried to contact wife at number listed in chart, did not answer.    Past Medical History:  Diagnosis Date   Hyperlipidemia        Medications Prior to Admission: Prior to Admission medications   Medication Sig Start Date End Date Taking? Authorizing Provider  meloxicam (MOBIC) 15 MG tablet Take 1 tablet (15 mg total) by mouth daily. 10/12/14   Lenn Sink, DPM     Allergies:   No Known  Allergies  Social History:   Social History   Socioeconomic History   Marital status: Married    Spouse name: Not on file   Number of children: Not on file   Years of education: Not on file   Highest education level: Not on file  Occupational History   Not on file  Tobacco Use   Smoking status: Never   Smokeless tobacco: Not on file  Substance and Sexual Activity   Alcohol use: Yes    Comment: socially   Drug use: Never   Sexual activity: Not on file  Other Topics Concern   Not on file  Social History Narrative   Not on file   Social Determinants of Health   Financial Resource Strain: Not on file  Food Insecurity: Not on file  Transportation Needs: Not on file  Physical Activity: Not on file  Stress: Not on file  Social Connections: Not on file  Intimate Partner Violence: Not on file    Family History:   The patient's family history includes Heart attack in his cousin.    ROS:  Please see the history of present illness.  All other ROS reviewed and negative.     Physical Exam/Data:   Vitals:   01/12/22 1645 01/12/22 1700 01/12/22 1733 01/12/22 1733  BP: (!) 158/135 (!) 117/96    Pulse: 84 80    Resp: (!) 21 18    SpO2: 98% 97%  97%  Weight:   80 kg   Height:  5\' 9"  (1.753 m) 5\' 9"  (1.753 m)    No intake or output data in the 24 hours ending 01/12/22 1741 Last 3 Weights 01/12/2022  Weight (lbs) 176 lb 5.9 oz  Weight (kg) 80 kg     Body mass index is 26.05 kg/m.  General:  Intubated, sedated HEENT: normal Neck: no JVD Cardiac:  normal S1, S2; RRR; no murmur  Lungs:  clear to auscultation bilaterally, no wheezing, rhonchi or rales  Ext: no edema Musculoskeletal:  No deformities, BUE and BLE strength normal and equal Skin: warm and dry  Neuro:  Sedated  Psych:  Sedated   EKG:  The ECG that was done 2/23 was personally reviewed and demonstrates sinus rhythm, ST elevation in V1 and V2 with reciprocal ST depression   Relevant CV  Studies:   Laboratory Data:  High Sensitivity Troponin:  No results for input(s): TROPONINIHS in the last 720 hours.    Chemistry Recent Labs  Lab 01/12/22 1729  NA 139  K 3.8  CL 104  GLUCOSE 240*  BUN 25*  CREATININE 1.70*    No results for input(s): PROT, ALBUMIN, AST, ALT, ALKPHOS, BILITOT in the last 168 hours. Lipids No results for input(s): CHOL, TRIG, HDL, LABVLDL, LDLCALC, CHOLHDL in the last 168 hours. Hematology Recent Labs  Lab 01/12/22 1653 01/12/22 1729  WBC 11.5*  --   RBC 5.15  --   HGB 15.8 15.6  HCT 45.0 46.0  MCV 87.4  --   MCH 30.7  --   MCHC 35.1  --   RDW 12.8  --   PLT 248  --    Thyroid No results for input(s): TSH, FREET4 in the last 168 hours. BNPNo results for input(s): BNP, PROBNP in the last 168 hours.  DDimer No results for input(s): DDIMER in the last 168 hours.   Radiology/Studies:  DG Chest Portable 1 View  Result Date: 01/12/2022 CLINICAL DATA:  Endotracheal tube placement. EXAM: PORTABLE CHEST 1 VIEW COMPARISON:  None. FINDINGS: Endotracheal tube with tip in the lower thoracic trachea approximately 2.8 cm from the carina. Enteric tube extends below the diaphragm with tip excluded by collimation. Cardiac silhouette appears enlarged at least somewhat accentuated by technique. Portable left hemithorax and bilateral lung apices are excluded by collimation. No focal airspace consolidation. No visible pleural effusion or pneumothorax. Right clavicular fixation hardware. IMPRESSION: 1. Endotracheal tube 2.8 cm from the carina. 2. Enteric tube extends below the diaphragm with tip excluded by collimation. Electronically Signed   By: 01/14/22 M.D.   On: 01/12/2022 17:24     Assessment and Plan:   Vfib Arrest  STEMI - Patient with witnessed arrest while exercising. EMS found patient in pulseless vtach. After shock went into asystole. Shocked 3 more times for vtach, vfib vs torsades - 18 minutes CPR  - Patient sent emergently to cath lab   - Plan for echocardiogram following procedure     Risk Assessment/Risk Scores:  }  TIMI Risk Score for ST  Elevation MI:   The patient's TIMI risk score is 2, which indicates a 2.2% risk of all cause mortality at 30 days.{       For questions or updates, please contact [facility] Please consult [email or web address] for contact info under     Signed, Maudry Mayhew, PA-C  01/12/2022 5:41 PM

## 2022-01-12 NOTE — ED Provider Notes (Signed)
[facility] EMERGENCY DEPARTMENT Provider Note  History   Chief Complaint  Patient presents with   Post Arrest   The history is provided by the EMS personnel.  Illness Location:  Witnessed arrest and collapse Quality:  Fell off a treadmill Severity:  Severe Onset quality:  Sudden Duration: Patient was down for 5 minutes prior to EMS arrival, and subsequently received 18 minutes of CPR. Timing:  Unable to specify Progression:  Unable to specify Chronicity:  New Context:  See MDM   No past medical history on file.  Social History   Tobacco Use   Smoking status: Never     No family history on file.  Review of Systems  Unable to perform ROS: Intubated   Physical Exam   Today's Vitals   01/12/22 1645 01/12/22 1700 01/12/22 1733 01/12/22 1733  BP: (!) 158/135 (!) 117/96    Pulse: 84 80    Resp: (!) 21 18    SpO2: 98% 97%  97%  Weight:   80 kg   Height:  _0  (1.753 m) _1  (1.753 m)      Physical Exam:  General: Intubated with King airway, Lucas device on chest (not performing CPR on arrival)   Head: Normocephalic, atraumatic.  No skull depressions or lacerations.  No conjunctival hemorrhage No periorbital ecchymoses, [place] Eyes, or Battle Sign bilaterally Ears atraumatic No nasal septal deviation or hematoma  PERRL, sclera anicteric. Mucus membranes moist.    Neck: Supple, trachea midline Unable to assess C-spine tenderness given GCS 5, no step-offs or deformities   Cardiovascular: RATE: 80 RHYTHM: Regular 2+ radial, femoral, DP pulses bilaterally   Respiratory/Chest Wall: Lungs clear to auscultation bilaterally Clavicles stable to compression Chest stable to AP and lateral compression, no crepitus   Extremities: Warm, well perfused. No gross deformities.    Gastrointestinal: Abdomen soft, non-distended FAST performed: No   Neurologic: Pupils equal bilaterally and reactive to light GCS 5, unable to perform remainder of  examination   Genitourinary: Normal male   Skin: Normal   Glasgow Coma Scale: GCS 5   Rectal: Deferred   Spine: Deferred given acuity of presentation   Other:      ED Course  Procedure Name: Intubation Date/Time: 01/12/2022 5:48 PM Performed by: Wynetta Fines, MD Pre-anesthesia Checklist: Patient identified, Emergency Drugs available, Suction available, Timeout performed and Patient being monitored Oxygen Delivery Method: Ambu bag Preoxygenation: Pre-oxygenation with 100% oxygen Induction Type: IV induction and Rapid sequence Ventilation: Oral airway inserted - appropriate to patient size Laryngoscope Size: Glidescope, Mac and 4 Grade View: Grade III Tube size: 8.0 mm Number of attempts: 1 Airway Equipment and Method: Video-laryngoscopy Placement Confirmation: ETT inserted through vocal cords under direct vision, Positive ETCO2, CO2 detector and Breath sounds checked- equal and bilateral Secured at: 25 cm Tube secured with: ETT holder Dental Injury: Teeth and Oropharynx as per pre-operative assessment  Future Recommendations: Recommend- induction with short-acting agent, and alternative techniques readily available    Medical Decision Making:  Jeremy Winters is a 56 y.o. male w/ h/o HLD who p/w AMS s/p cardiac arrest with ROSC.   Patient was exercising at [employer], had witnessed arrest and collapsed  On EMS arrival (within 5 min), patient was in pulseless VT. Shocked x1, patient progressed to asystole.  EMS applied a Cisco continued CPR.  Patient received a total of epi x2 and shocked x3 for ventricular arrhythmias (VT/VF versus torsades).  Total CPR 18 minutes.  King airway applied in  route.  EMS reports some purposeful movement in route, 2.5 mg Versed for agitation.  On arrival to the ED, strong pulses, sinus rhythm on the monitor.  GCS 5.  RSI to exchange King airway for ETT.  Cardiology consulted for postarrest, they plan to take patient to cardiac Cath  Lab.  ER provider interpretation of Imaging / Radiology:  CXR: ETT 2.8 cm from the carina CT Head: Pending at the time of transfer to Cath Lab  ER provider interpretation of EKG:  Sinus rhythm, STE in V1/V2 with reciprocal changes.  ER provider interpretation of Labs:  Pending CBC, MP, mag, Phos, troponin, procalcitonin, lactic, UDS, blood cultures, UA coags, COVID/flu swab at the time of transfer to the Cath Lab  Key medications administered in the ER:  Roc and etomidate for RSI Propofol and fentanyl for sedation Amiodarone load for ectopy on monitor  Diagnoses considered: Most likely thought to be cardiac in nature, despite no cardiac history though differential remains wide at this time. Ddx includes alcohol, electrolyte abnormalities (Na/Ca), endocrine (myxedema coma, thyroid storm), encephalopathy (hepatic, HTN, heat, wernicke), infection (encephalitis, sepsis), toxidromes of overdose or withdrawal, hypoxia, uremia, trauma (hemorrhagic shock, TBI), hypothermia, hypoglycemia, psychogenic, seizure, stroke, or intracranial bleed. Unlikely dementia as acute in nature and not progressively degenerative.   Consulted: Cardiology  Patient seen in conjunction with Dr. Nicola Girt medical dictation software was used in the creation of this note.   Electronically signed by: Wynetta Fines, MD on 01/12/2022 at 5:46 PM  Clinical Impression: Cardiac arrest with successful resuscitation  Dispo: To Cardiac Cath Lab, anticipate critical care admission afterwards given patient intubated    Wynetta Fines, MD 01/12/22 1806    Lajean Saver, MD 01/13/22 [phone]

## 2022-01-12 NOTE — ED Notes (Signed)
To cath lab at this time w/ cath lab MD, RN Marchelle Folks, ED techs.

## 2022-01-12 NOTE — ED Notes (Signed)
Preparing to leave to cath lab and pt noted to have significant ectopy on EKG tracing. Pt in and out of vtach, maintaining pulses. MD [place] at bedside, ordered another 150mg  bolus of amio which was administered

## 2022-01-13 ENCOUNTER — Inpatient Hospital Stay (HOSPITAL_COMMUNITY): Payer: 59

## 2022-01-13 ENCOUNTER — Encounter (HOSPITAL_COMMUNITY): Payer: Self-pay | Admitting: Cardiology

## 2022-01-13 DIAGNOSIS — I469 Cardiac arrest, cause unspecified: Secondary | ICD-10-CM

## 2022-01-13 LAB — HEPARIN LEVEL (UNFRACTIONATED)
Heparin Unfractionated: <0.1 IU/mL — ABNORMAL LOW (ref 0.30–0.70)
Heparin Unfractionated: 0.24 IU/mL — ABNORMAL LOW (ref 0.30–0.70)

## 2022-01-13 LAB — GLUCOSE, CAPILLARY
Glucose-Capillary: 120 mg/dL — ABNORMAL HIGH (ref 70–99)
Glucose-Capillary: 120 mg/dL — ABNORMAL HIGH (ref 70–99)
Glucose-Capillary: 126 mg/dL — ABNORMAL HIGH (ref 70–99)
Glucose-Capillary: 127 mg/dL — ABNORMAL HIGH (ref 70–99)
Glucose-Capillary: 132 mg/dL — ABNORMAL HIGH (ref 70–99)
Glucose-Capillary: 137 mg/dL — ABNORMAL HIGH (ref 70–99)
Glucose-Capillary: 152 mg/dL — ABNORMAL HIGH (ref 70–99)

## 2022-01-13 LAB — POCT I-STAT, CHEM 8
BUN: 23 mg/dL — ABNORMAL HIGH (ref 6–20)
Calcium, Ion: 1.07 mmol/L — ABNORMAL LOW (ref 1.15–1.40)
Chloride: 104 mmol/L (ref 98–111)
Creatinine, Ser: 1.2 mg/dL (ref 0.61–1.24)
Glucose, Bld: 217 mg/dL — ABNORMAL HIGH (ref 70–99)
HCT: 43 % (ref 39.0–52.0)
Hemoglobin: 14.6 g/dL (ref 13.0–17.0)
Potassium: 3.4 mmol/L — ABNORMAL LOW (ref 3.5–5.1)
Sodium: 139 mmol/L (ref 135–145)
TCO2: 25 mmol/L (ref 22–32)

## 2022-01-13 LAB — BLOOD CULTURE ID PANEL (REFLEXED) - BCID2

## 2022-01-13 LAB — LIPID PANEL
Cholesterol: 192 mg/dL (ref 0–200)
HDL: 38 mg/dL — ABNORMAL LOW (ref >40)
LDL Cholesterol: 100 mg/dL — ABNORMAL HIGH (ref 0–99)
Total CHOL/HDL Ratio: 5.1 RATIO
Triglycerides: 270 mg/dL — ABNORMAL HIGH (ref <150)
VLDL: 54 mg/dL — ABNORMAL HIGH (ref 0–40)

## 2022-01-13 LAB — COOXEMETRY PANEL
Carboxyhemoglobin: 1 % (ref 0.5–1.5)
Methemoglobin: <0.7 % (ref 0.0–1.5)
O2 Saturation: 79 %
Total hemoglobin: 11.5 g/dL — ABNORMAL LOW (ref 12.0–16.0)

## 2022-01-13 LAB — TROPONIN I (HIGH SENSITIVITY)
Troponin I (High Sensitivity): 9382 ng/L (ref <18)
Troponin I (High Sensitivity): 9472 ng/L (ref <18)

## 2022-01-13 LAB — COMPREHENSIVE METABOLIC PANEL
ALT: 142 U/L — ABNORMAL HIGH (ref 0–44)
AST: 130 U/L — ABNORMAL HIGH (ref 15–41)
Albumin: 3.4 g/dL — ABNORMAL LOW (ref 3.5–5.0)
Alkaline Phosphatase: 48 U/L (ref 38–126)
Anion gap: 10 (ref 5–15)
BUN: 22 mg/dL — ABNORMAL HIGH (ref 6–20)
CO2: 23 mmol/L (ref 22–32)
Calcium: 7.9 mg/dL — ABNORMAL LOW (ref 8.9–10.3)
Chloride: 103 mmol/L (ref 98–111)
Creatinine, Ser: 1.36 mg/dL — ABNORMAL HIGH (ref 0.61–1.24)
GFR, Estimated: >60 mL/min (ref >60)
Glucose, Bld: 166 mg/dL — ABNORMAL HIGH (ref 70–99)
Potassium: 4.1 mmol/L (ref 3.5–5.1)
Sodium: 136 mmol/L (ref 135–145)
Total Bilirubin: 0.8 mg/dL (ref 0.3–1.2)
Total Protein: 6.1 g/dL — ABNORMAL LOW (ref 6.5–8.1)

## 2022-01-13 LAB — POCT I-STAT 7, (LYTES, BLD GAS, ICA,H+H)
Acid-base deficit: 2 mmol/L (ref 0.0–2.0)
Bicarbonate: 24.6 mmol/L (ref 20.0–28.0)
Calcium, Ion: 1.11 mmol/L — ABNORMAL LOW (ref 1.15–1.40)
HCT: 42 % (ref 39.0–52.0)
Hemoglobin: 14.3 g/dL (ref 13.0–17.0)
O2 Saturation: 100 %
Potassium: 3.4 mmol/L — ABNORMAL LOW (ref 3.5–5.1)
Sodium: 139 mmol/L (ref 135–145)
TCO2: 26 mmol/L (ref 22–32)
pCO2 arterial: 49.8 mmHg — ABNORMAL HIGH (ref 32–48)
pH, Arterial: 7.302 — ABNORMAL LOW (ref 7.35–7.45)
pO2, Arterial: 303 mmHg — ABNORMAL HIGH (ref 83–108)

## 2022-01-13 LAB — CBC
HCT: 40.8 % (ref 39.0–52.0)
Hemoglobin: 14.3 g/dL (ref 13.0–17.0)
MCH: 30.8 pg (ref 26.0–34.0)
MCHC: 35 g/dL (ref 30.0–36.0)
MCV: 87.7 fL (ref 80.0–100.0)
Platelets: 197 10*3/uL (ref 150–400)
RBC: 4.65 MIL/uL (ref 4.22–5.81)
RDW: 13.1 % (ref 11.5–15.5)
WBC: 15.8 10*3/uL — ABNORMAL HIGH (ref 4.0–10.5)
nRBC: 0 % (ref 0.0–0.2)

## 2022-01-13 LAB — HEMOGLOBIN A1C
Hgb A1c MFr Bld: 5.2 % (ref 4.8–5.6)
Mean Plasma Glucose: 102.54 mg/dL

## 2022-01-13 LAB — ECHOCARDIOGRAM COMPLETE
AR max vel: 2.47 cm2
AV Peak grad: 18.8 mmHg
Ao pk vel: 2.17 m/s
Area-P 1/2: 5.42 cm2
Height: 69 in
S' Lateral: 3.6 cm
Weight: 3139.35 oz

## 2022-01-13 LAB — POCT I-STAT EG7
Acid-base deficit: 2 mmol/L (ref 0.0–2.0)
Bicarbonate: 25.9 mmol/L (ref 20.0–28.0)
Calcium, Ion: 1.13 mmol/L — ABNORMAL LOW (ref 1.15–1.40)
HCT: 44 % (ref 39.0–52.0)
Hemoglobin: 15 g/dL (ref 13.0–17.0)
O2 Saturation: 61 %
Potassium: 3.4 mmol/L — ABNORMAL LOW (ref 3.5–5.1)
Sodium: 139 mmol/L (ref 135–145)
TCO2: 28 mmol/L (ref 22–32)
pCO2, Ven: 56.1 mmHg (ref 44–60)
pH, Ven: 7.271 (ref 7.25–7.43)
pO2, Ven: 37 mmHg (ref 32–45)

## 2022-01-13 LAB — MAGNESIUM: Magnesium: 2 mg/dL (ref 1.7–2.4)

## 2022-01-13 LAB — TRIGLYCERIDES: Triglycerides: 286 mg/dL — ABNORMAL HIGH (ref <150)

## 2022-01-13 LAB — LACTIC ACID, PLASMA
Lactic Acid, Venous: 1.7 mmol/L (ref 0.5–1.9)
Lactic Acid, Venous: 1.9 mmol/L (ref 0.5–1.9)

## 2022-01-13 LAB — MRSA NEXT GEN BY PCR, NASAL: MRSA by PCR Next Gen: NOT DETECTED

## 2022-01-13 LAB — POTASSIUM: Potassium: 4 mmol/L (ref 3.5–5.1)

## 2022-01-13 MED ORDER — DOCUSATE SODIUM 100 MG PO CAPS
100.0000 mg | ORAL_CAPSULE | Freq: Two times a day (BID) | ORAL | Status: DC
  Administered 2022-01-13 – 2022-01-16 (×6): 100 mg via ORAL
  Filled 2022-01-13 (×7): qty 1

## 2022-01-13 MED ORDER — METOCLOPRAMIDE HCL 5 MG/ML IJ SOLN: 5.0000 mg | Freq: Three times a day (TID) | INTRAMUSCULAR | Status: DC

## 2022-01-13 MED ORDER — PROCHLORPERAZINE EDISYLATE 10 MG/2ML IJ SOLN
10.0000 mg | Freq: Four times a day (QID) | INTRAMUSCULAR | Status: DC | PRN
  Administered 2022-01-13 – 2022-01-15 (×3): 10 mg via INTRAVENOUS
  Filled 2022-01-13 (×4): qty 2

## 2022-01-13 MED ORDER — POLYETHYLENE GLYCOL 3350 17 G PO PACK
17.0000 g | PACK | Freq: Every day | ORAL | Status: DC
  Administered 2022-01-14 – 2022-01-15 (×2): 17 g via ORAL
  Filled 2022-01-13 (×3): qty 1

## 2022-01-13 MED ORDER — ATORVASTATIN CALCIUM 80 MG PO TABS
80.0000 mg | ORAL_TABLET | Freq: Every day | ORAL | Status: DC
  Administered 2022-01-14 – 2022-01-21 (×7): 80 mg via ORAL
  Filled 2022-01-13 (×7): qty 1

## 2022-01-13 MED ORDER — ACETAMINOPHEN 325 MG PO TABS
650.0000 mg | ORAL_TABLET | ORAL | Status: DC | PRN
  Administered 2022-01-13: 650 mg via ORAL
  Filled 2022-01-13: qty 2

## 2022-01-13 MED ORDER — PANTOPRAZOLE SODIUM 40 MG PO TBEC
40.0000 mg | DELAYED_RELEASE_TABLET | Freq: Every day | ORAL | Status: DC
  Administered 2022-01-13 – 2022-01-17 (×5): 40 mg via ORAL
  Filled 2022-01-13 (×5): qty 1

## 2022-01-13 MED ORDER — ASPIRIN 81 MG PO CHEW
81.0000 mg | CHEWABLE_TABLET | Freq: Every day | ORAL | Status: DC
  Administered 2022-01-14 – 2022-01-17 (×4): 81 mg via ORAL
  Filled 2022-01-13 (×4): qty 1

## 2022-01-13 MED ORDER — ORAL CARE MOUTH RINSE
15.0000 mL | Freq: Two times a day (BID) | OROMUCOSAL | Status: DC
  Administered 2022-01-13 – 2022-01-16 (×7): 15 mL via OROMUCOSAL

## 2022-01-13 MED ORDER — CHLORHEXIDINE GLUCONATE CLOTH 2 % EX PADS
6.0000 | MEDICATED_PAD | Freq: Every day | CUTANEOUS | Status: DC
  Administered 2022-01-14 – 2022-01-16 (×3): 6 via TOPICAL

## 2022-01-13 MED FILL — Verapamil HCl IV Soln 2.5 MG/ML: INTRAVENOUS | Qty: 2 | Status: AC

## 2022-01-13 MED FILL — Heparin Sod (Porcine)-NaCl IV Soln 1000 Unit/500ML-0.9%: INTRAVENOUS | Qty: 1000 | Status: AC

## 2022-01-13 NOTE — Progress Notes (Signed)
NAME:  Jeremy Winters, MRN:  [id number], DOB:  06-Jul-1966, LOS: 1 ADMISSION DATE:  01/12/2022, CONSULTATION DATE:  01/12/2022 REFERRING MD:  Cardiology, CHIEF COMPLAINT:  Cardiac Arrest   History of Present Illness:  Patient is currently intubated and sedated on mechanical ventilation, therefore history obtained from medical chart review.   56 year old male with prior history of HTN and HLD who was exercising at [employer] with witnessed collapse.  Patient did not receive CPR for 5 minutes prior to EMS arrival.  Found in pulseless Vtach.  Received ACLS measures, including epi x2, and defibrillations for vtach vs vib x 3 prior to ROSC after 18 minutes with [facility] airway placed.   Per triage note, EMS had reported some purposeful movement en route and was given versed for agitation.    On arrival to ER, patient intubated.  EKG showed STE in V1 and V2 with reciprocal changes in lateral and inferior leads.  Given amio bolus and started on amio gtt.  No family able to be contacted thus far.  Taken emergently to cath lab.  PCCM consulted for admission after cath lab.    Labs: K 3.8, glucose 240, sCr 1.70, iCa 1.14, WBC 11.5.  Remaining labs/ SARS/ flu pending  Pertinent  Medical History  HTN, HLD  Significant Hospital Events: Including procedures, antibiotic start and stop dates in addition to other pertinent events   2/23 admitted after vfib cardiac arrest, ROSC after 18 mins.  Emergent cath 2/24 Extubated  Interim History / Subjective:  Awake and alert with fiancee at bedside  Objective   Blood pressure 110/72, pulse (!) 55, temperature 99.1 F (37.3 C), resp. rate 19, height 5\' 9"  (1.753 m), weight 89 kg, SpO2 95 %. PAP: (21-43)/(14-33) 43/15 CVP:  [4 mmHg-25 mmHg] 10 mmHg CO:  [1.7 L/min-6.1 L/min] 6.1 L/min CI:  [0.8 L/min/m2-2.9 L/min/m2] 2.9 L/min/m2  Vent Mode: CPAP;PSV FiO2 (%):  [40 %-100 %] 40 % Set Rate:  [18 bmp] 18 bmp Vt Set:  [560 mL] 560 mL PEEP:  [5 cmH20] 5  cmH20 Pressure Support:  [5 cmH20] 5 cmH20 Plateau Pressure:  [12 cmH20-15 cmH20] 15 cmH20   Intake/Output Summary (Last 24 hours) at 01/13/2022 0901 Last data filed at 01/13/2022 0600 Gross per 24 hour  Intake 1158.9 ml  Output 1130 ml  Net 28.9 ml   Filed Weights   01/12/22 1733 01/12/22 1938 01/12/22 2000  Weight: 80 kg 90.7 kg 89 kg   Physical Exam: General: Critically ill-appearing, no acute distress HENT: [place], AT, ETT in place Eyes: EOMI, no scleral icterus Respiratory: Diminished but clear to auscultation bilaterally.  No crackles, wheezing or rales Cardiovascular: RRR, -M/R/G, no JVD GI: BS+, soft, nontender Extremities:-Edema,-tenderness Neuro: Awake alert and following commands. Moves extremities x 4 GU: Foley in place   Resolved Hospital Problem list    Assessment & Plan:   Vfib/VT arrest STEMI -S/p CPR x 18 min. Did not receive CPR ~5 min until EMS arrived. Reported purposeful movement en route however required sedation for agitaiton  -LHC demonstrated complete occlusion left circumflex with good collaterals. Cardiac arrest may have been stress-induced in setting of exercise. Plan: Telemetry Amio gtt, heparin gtt, ASA Trend trop Echo read pending Supportive care  Acute respiratory failure in the setting of cardiac arrest Plan: Extubated to [place] Wean supplemental O2 for goal >88%  Acute encephalopathy - resolving CT head neg Plan: Reorient as needed  AKI in the setting of hypoperfusion f/b contrast  Plan MIVF  Foley Strict I/Os Trend BMP / urinary output Replace electrolytes as indicated Avoid nephrotoxic agents, ensure adequate renal perfusion   Hyperglycemia Ha1c 5.2 Plan  SSI sensitive/ CBG q4hr  Hx HTN Hx HLD - lipid panel - on no home meds   Best Practice (right click and "Reselect all SmartList Selections" daily)   Diet/type: NPO Advance as tolerated DVT prophylaxis: systemic heparin GI prophylaxis: PPI Lines: Central  line Foley:  Yes, and it is still needed Code Status:  full code Last date of multidisciplinary goals of care discussion [pending]  [name] updated at bedside  Labs   CBC: Recent Labs  Lab 01/12/22 1653 01/12/22 1729 01/12/22 1816 01/12/22 1818 01/12/22 1824 01/12/22 2036 01/13/22 0249  WBC 11.5*  --   --   --   --   --  15.8*  HGB 15.8   < > 15.3 14.6 14.3 15.0 14.3  HCT 45.0   < > 45.0 43.0 42.0 44.0 40.8  MCV 87.4  --   --   --   --   --  87.7  PLT 248  --   --   --   --   --  197   < > = values in this interval not displayed.    Basic Metabolic Panel: Recent Labs  Lab 01/12/22 1653 01/12/22 1729 01/12/22 1813 01/12/22 1816 01/12/22 1818 01/12/22 1824 01/12/22 2036 01/12/22 2044 01/12/22 2147 01/13/22 0005 01/13/22 0249  NA 138 139   < > 139 139 139 136  --  135  --  136  K 3.8 3.8   < > 3.4* 3.4* 3.4* 6.3* 6.6* 5.4* 4.0 4.1  CL 101 104  --  102 104  --   --   --  105  --  103  CO2 19*  --   --   --   --   --   --   --  22  --  23  GLUCOSE 238* 240*  --  226* 217*  --   --   --  167*  --  166*  BUN 21* 25*  --  25* 23*  --   --   --  25*  --  22*  CREATININE 1.64* 1.70*  --  1.20 1.20  --   --   --  1.37*  --  1.36*  CALCIUM 8.4*  --   --   --   --   --   --   --  7.3*  --  7.9*  MG 2.3  --   --   --   --   --   --  2.2  --   --  2.0  PHOS 6.9*  --   --   --   --   --   --   --   --   --   --    < > = values in this interval not displayed.   GFR: Estimated Creatinine Clearance: 68.5 mL/min (A) (by C-G formula based on SCr of 1.36 mg/dL (H)). Recent Labs  Lab 01/12/22 1653 01/12/22 1708 01/12/22 2044 01/13/22 0005 01/13/22 0249  PROCALCITON <0.05  --   --   --   --   WBC 11.5*  --   --   --  15.8*  LATICACIDVEN  --  7.8* 1.6 1.7 1.9    Liver Function Tests: Recent Labs  Lab 01/12/22 1653 01/13/22 0249  AST 143* 130*  ALT 152* 142*  ALKPHOS 56 48  BILITOT 0.7 0.8  PROT 6.9 6.1*  ALBUMIN 3.8 3.4*   No results for input(s):  LIPASE, AMYLASE in the last 168 hours. No results for input(s): AMMONIA in the last 168 hours.  ABG    Component Value Date/Time   PHART 7.319 (L) 01/12/2022 2036   PCO2ART 45.2 01/12/2022 2036   PO2ART 406 (H) 01/12/2022 2036   HCO3 23.2 01/12/2022 2036   TCO2 25 01/12/2022 2036   ACIDBASEDEF 3.0 (H) 01/12/2022 2036   O2SAT 100 01/12/2022 2036     Coagulation Profile: No results for input(s): INR, PROTIME in the last 168 hours.  Cardiac Enzymes: No results for input(s): CKTOTAL, CKMB, CKMBINDEX, TROPONINI in the last 168 hours.  HbA1C: Hgb A1c MFr Bld  Date/Time Value Ref Range Status  01/12/2022 07:30 PM 5.2 4.8 - 5.6 % Final    Comment:    (NOTE) Pre diabetes:          5.7%-6.4%  Diabetes:              >6.4%  Glycemic control for   <7.0% adults with diabetes     CBG: Recent Labs  Lab 01/12/22 2057 01/13/22 0010 01/13/22 0357 01/13/22 0750  GLUCAP 221* 120* 152* 137*   Critical care time: 60 mins    The patient is critically ill with multiple organ systems failure and requires high complexity decision making for assessment and support, frequent evaluation and titration of therapies, application of advanced monitoring technologies and extensive interpretation of multiple databases.   Rodman Pickle, M.D. [facility] Pulmonary/Critical Care Medicine 01/13/2022 9:01 AM   Please see Amion for pager number to reach on-call Pulmonary and Critical Care Team.

## 2022-01-13 NOTE — Progress Notes (Signed)
Contacted by Pharmacy regarding 1/4 bottles growing streptococcus from BCID report. Patient currently afebrile and nontoxic appearing. Will hold off on antibiotics and repeat blood cultures as I suspect this is likely a contaminant.

## 2022-01-13 NOTE — Progress Notes (Signed)
Progress Note  Patient Name: Jeremy Winters Date of Encounter: 01/13/2022  [facility] Cardiologist: Peter Martinique, MD    Subjective   S/p VT/VF arrest >> cath lab due to questionable STE >> MVD >> CO/CI low so started on low dose DBA  DBA 2.5/amio/NE/hep gtts  CVP 8, PAP 49/16, PAPI 4, MAP 81 (noninvasive) CO 6.1, CI 2.9  Somewhat responsive overnight  Inpatient Medications    Scheduled Meds:  aspirin  81 mg Per Tube Daily   atorvastatin  80 mg Per Tube Daily   chlorhexidine gluconate (MEDLINE KIT)  15 mL Mouth Rinse BID   Chlorhexidine Gluconate Cloth  6 each Topical Daily   docusate  100 mg Per Tube BID   fentaNYL (SUBLIMAZE) injection  50 mcg Intravenous Once   insulin aspart  0-9 Units Subcutaneous Q4H   mouth rinse  15 mL Mouth Rinse 10 times per day   pantoprazole sodium  40 mg Per Tube QHS   polyethylene glycol  17 g Per Tube Daily   sodium chloride flush  3 mL Intravenous Q12H   Continuous Infusions:  sodium chloride     amiodarone (NEXTERONE) IV bolus only 150 mg/100 mL Stopped (01/12/22 1708)   amiodarone (NEXTERONE) IV bolus only 150 mg/100 mL Stopped (01/12/22 1720)   amiodarone 30 mg/hr (01/13/22 0600)   DOBUTamine 2.5 mcg/kg/min (01/13/22 0600)   fentaNYL infusion INTRAVENOUS 200 mcg/hr (01/13/22 0600)   heparin 1,100 Units/hr (01/13/22 0600)   lactated ringers 75 mL/hr at 01/13/22 0603   norepinephrine (LEVOPHED) Adult infusion 8 mcg/min (01/13/22 0600)   propofol (DIPRIVAN) infusion 45 mcg/kg/min (01/13/22 0600)   PRN Meds: sodium chloride, acetaminophen, amiodarone (NEXTERONE) IV bolus only 150 mg/100 mL, amiodarone (NEXTERONE) IV bolus only 150 mg/100 mL, etomidate, fentaNYL (SUBLIMAZE) injection, fentaNYL (SUBLIMAZE) injection, fentaNYL, ondansetron (ZOFRAN) IV, sodium chloride flush   Vital Signs    Vitals:   01/13/22 0300 01/13/22 0315 01/13/22 0359 01/13/22 0516  BP: 117/65 116/66 119/65   Pulse: 60 61 60   Resp: 18 18 (!) 21    Temp: 99 F (37.2 C) 99 F (37.2 C) 99 F (37.2 C)   TempSrc:    Core  SpO2: 93% 93% 95%   Weight:      Height:        Intake/Output Summary (Last 24 hours) at 01/13/2022 0618 Last data filed at 01/13/2022 0600 Gross per 24 hour  Intake 1158.9 ml  Output 1130 ml  Net 28.9 ml   Last 3 Weights 01/12/2022 01/12/2022 01/12/2022  Weight (lbs) 196 lb 3.4 oz 200 lb 176 lb 5.9 oz  Weight (kg) 89 kg 90.719 kg 80 kg      Telemetry    SR with occ PVCs- Personally Reviewed  ECG    Junctional with RBBB and LAFB - Personally Reviewed  Physical Exam   GEN: Intubated, sedated Neck: RIJ swan Cardiac: RRR, no murmurs, rubs, or gallops. Warm and well perfused. Respiratory: Clear to auscultation anteriorly. GI: Soft, nontender, non-distended  MS: No edema; No deformity. Neuro:  Sedated Psych: Sedated  Labs    High Sensitivity Troponin:   Recent Labs  Lab 01/12/22 1653 01/12/22 2044 01/13/22 0005 01/13/22 0249  TROPONINIHS 49* 6,634* 9,382* 9,472*     Chemistry Recent Labs  Lab 01/12/22 1653 01/12/22 1729 01/12/22 1816 01/12/22 2036 01/12/22 2044 01/12/22 2147 01/13/22 0005 01/13/22 0249  NA 138   < > 139 136  --  135  --  136  K 3.8   < >  3.4* 6.3* 6.6* 5.4* 4.0 4.1  CL 101   < > 102  --   --  105  --  103  CO2 19*  --   --   --   --  22  --  23  GLUCOSE 238*   < > 226*  --   --  167*  --  166*  BUN 21*   < > 25*  --   --  25*  --  22*  CREATININE 1.64*   < > 1.20  --   --  1.37*  --  1.36*  CALCIUM 8.4*  --   --   --   --  7.3*  --  7.9*  MG 2.3  --   --   --  2.2  --   --  2.0  PROT 6.9  --   --   --   --   --   --  6.1*  ALBUMIN 3.8  --   --   --   --   --   --  3.4*  AST 143*  --   --   --   --   --   --  130*  ALT 152*  --   --   --   --   --   --  142*  ALKPHOS 56  --   --   --   --   --   --  48  BILITOT 0.7  --   --   --   --   --   --  0.8  GFRNONAA 49*  --   --   --   --  >60  --  >60  ANIONGAP 18*  --   --   --   --  8  --  10   < > = values in this  interval not displayed.    Lipids  Recent Labs  Lab 01/13/22 0000 01/13/22 0249  CHOL 192  --   TRIG 270* 286*  HDL 38*  --   LDLCALC 100*  --   CHOLHDL 5.1  --     Hematology Recent Labs  Lab 01/12/22 1653 01/12/22 1729 01/12/22 1816 01/12/22 2036 01/13/22 0249  WBC 11.5*  --   --   --  15.8*  RBC 5.15  --   --   --  4.65  HGB 15.8   < > 15.3 15.0 14.3  HCT 45.0   < > 45.0 44.0 40.8  MCV 87.4  --   --   --  87.7  MCH 30.7  --   --   --  30.8  MCHC 35.1  --   --   --  35.0  RDW 12.8  --   --   --  13.1  PLT 248  --   --   --  197   < > = values in this interval not displayed.   Thyroid No results for input(s): TSH, FREET4 in the last 168 hours.  BNPNo results for input(s): BNP, PROBNP in the last 168 hours.  DDimer No results for input(s): DDIMER in the last 168 hours.   Radiology    CT HEAD WO CONTRAST  Result Date: 01/13/2022 CLINICAL DATA:  Altered mental status. EXAM: CT HEAD WITHOUT CONTRAST TECHNIQUE: Contiguous axial images were obtained from the base of the skull through the vertex without intravenous contrast. RADIATION DOSE REDUCTION: This exam was performed according to the departmental dose-optimization  program which includes automated exposure control, adjustment of the mA and/or kV according to patient size and/or use of iterative reconstruction technique. COMPARISON:  None. FINDINGS: Brain: The ventricles and sulci are appropriate size for the patient's age. The gray-white matter discrimination is preserved. There is no acute intracranial hemorrhage. No mass effect or midline shift. No extra-axial fluid collection. Vascular: No hyperdense vessel or unexpected calcification. Skull: Normal. Negative for fracture or focal lesion. Sinuses/Orbits: There is diffuse mucoperiosteal thickening of paranasal sinuses with opacification of multiple ethmoid air cells. No air-fluid level. Mastoid air cells are clear. Other: Small right parietal scalp contusion IMPRESSION: 1.  No acute intracranial pathology. 2. Paranasal sinus disease. Electronically Signed   By: Anner Crete M.D.   On: 01/13/2022 02:25   CARDIAC CATHETERIZATION  Result Date: 01/12/2022   Mid LM to Dist LM lesion is 30% stenosed.   Dist LAD lesion is 90% stenosed.   Prox Cx to Mid Cx lesion is 100% stenosed.   Prox RCA to Mid RCA lesion is 60% stenosed.   Dist RCA lesion is 80% stenosed.   The left ventricular ejection fraction is 35-45% by visual estimate. 3 vessel obstructive CAD. There is 90% stenosis in the distal LAD. The LCX is a moderately large vessel with 100% mid vessel CTO with well established collaterals. The RCA has an 80% distal lesion. LV function is mild to moderately reduced but may not represent true function since patient was significantly tachycardic at the time. Low/normal LV filling pressures with PCWP mean of 7. Normal right heart pressures. Low cardiac output with Index 1.65. Plan: his cardiac arrest appears to be related to demand ischemia ( walking on treadmill in presence of chronic severe CAD). Recommend medical therapy for now. With recovery of neurologic function we can decide ultimate revascularization strategy. Will continue IV amiodarone. Resume Heparin in 8 hours post sheath removal. ASA and statin therapy. CCM to manage vent and critical care issues.   DG Chest Port 1 View  Result Date: 01/12/2022 CLINICAL DATA:  Cardiopulmonary arrest EXAM: PORTABLE CHEST 1 VIEW COMPARISON:  4:56 p.m. FINDINGS: Right internal jugular Swan-Ganz catheter tip noted within the right pulmonary artery. Endotracheal tube unchanged 2.8 cm above the carina. Nasogastric tube extends into the upper abdomen beyond the margin of the examination. Lung volumes are small. Small left pleural effusion is not excluded. No pneumothorax. Cardiac size within normal limits. Pulmonary vascularity is normal. Right clavicle ORIF noted. IMPRESSION: Right internal jugular Swan-Ganz catheter tip within the right  pulmonary artery. No pneumothorax. Otherwise stable support tubes. Pulmonary hypoinflation. Possible small left pleural effusion. Electronically Signed   By: Fidela Salisbury M.D.   On: 01/12/2022 20:18   DG Chest Portable 1 View  Result Date: 01/12/2022 CLINICAL DATA:  Endotracheal tube placement. EXAM: PORTABLE CHEST 1 VIEW COMPARISON:  None. FINDINGS: Endotracheal tube with tip in the lower thoracic trachea approximately 2.8 cm from the carina. Enteric tube extends below the diaphragm with tip excluded by collimation. Cardiac silhouette appears enlarged at least somewhat accentuated by technique. Portable left hemithorax and bilateral lung apices are excluded by collimation. No focal airspace consolidation. No visible pleural effusion or pneumothorax. Right clavicular fixation hardware. IMPRESSION: 1. Endotracheal tube 2.8 cm from the carina. 2. Enteric tube extends below the diaphragm with tip excluded by collimation. Electronically Signed   By: Dahlia Bailiff M.D.   On: 01/12/2022 17:24    Cardiac Studies     Patient Profile     56 y.o. male  HTN, HLD s/p VT arrest   Assessment & Plan     VT arrest:  Due to MVD.  Continue amiodarone, keep lytes stable. CAD:  CTO feeding two large OMs as well as high grade RCA and distal LAD.  If neurologically intact , would be best served with CABG given burden of disease.   ACS:  Cont hep, ASA, atorvastatin Cardiogenic shock:  Due to post code stunning, CO/CI normal now, d/c DBA now.  Wean pressors to MAP 65-44mmHG, consider arterial line.  F/U echo today AKI:  Monitor for now Respiratory failure:  On minimal vent settings, per primary team     For questions or updates, please contact [place] Please consult [email or web address] for contact info under    CRITICAL CARE Performed by: Lenna Sciara   Total critical care time: 40 minutes. Critical care time was exclusive of separately billable procedures and treating other patients. Critical care was  necessary to treat or prevent imminent or life-threatening deterioration. Critical care was time spent personally by me on the following activities: development of treatment plan with patient and/or surrogate as well as nursing, discussions with consultants, evaluation of patient's response to treatment, examination of patient, obtaining history from patient or surrogate, ordering and performing treatments and interventions, ordering and review of laboratory studies, ordering and review of radiographic studies, pulse oximetry and re-evaluation of patient's condition.     Signed, Early Osmond, MD  01/13/2022, 6:18 AM

## 2022-01-13 NOTE — Progress Notes (Signed)
Echocardiogram 2D Echocardiogram has been performed.  Jeremy Winters 01/13/2022, 11:06 AM

## 2022-01-13 NOTE — Progress Notes (Signed)
°   01/13/22 0750  Airway 8 mm  Placement Date/Time: 01/12/22 1650   Placed By: ED Physician  Airway Device: Endotracheal Tube  ETT Types: Oral  Size (mm): 8 mm  Cuffed: Cuffed  Insertion attempts: 1  Airway Equipment: Video Laryngoscope  Placement Confirmation: Direct Visualization...  Secured at (cm) 26 cm  Measured From Lips  Secured Location Left  Secured By Actuary Repositioned Yes  Prone position No  Cuff Pressure (cm H2O) Green OR 18-26 CmH2O  Site Condition Dry  Adult Ventilator Settings  Vent Type Servo i  Humidity HME  Vent Mode PRVC  Vt Set 560 mL  Set Rate 18 bmp  FiO2 (%) 40 %  I Time 0.9 Sec(s)  PEEP 5 cmH20  Adult Ventilator Measurements  Peak Airway Pressure 19 L/min  Mean Airway Pressure 9 cmH20  Plateau Pressure 15 cmH20  Resp Rate Spontaneous 0 br/min  Resp Rate Total 18 br/min  Exhaled Vt 566 mL  Measured Ve 10.7 mL  I:E Ratio Measured 1:2.7  Auto PEEP 0 cmH20  Total PEEP 5 cmH20  SpO2 96 %  Adult Ventilator Alarms  Alarms On Y  Ve High Alarm 21 L/min  Ve Low Alarm 4 L/min  Resp Rate High Alarm 38 br/min  Resp Rate Low Alarm 10  PEEP Low Alarm 3 cmH2O  Press High Alarm 40 cmH2O  Daily Weaning Assessment  Daily Assessment of Readiness to Wean Wean protocol criteria not met  Reason not met Apnea  Breath Sounds  Bilateral Breath Sounds Diminished  Airway Suctioning/Secretions  Suction Type ETT  Suction Device  Catheter  Secretion Amount Small  Secretion Color Tan  Secretion Consistency Thick  Suction Tolerance Tolerated well  Suctioning Adverse Effects None

## 2022-01-13 NOTE — Procedures (Signed)
Extubation Procedure Note  Patient Details:   Name: Aristotle Lieb DOB: 06/21/1966 MRN: [id number]   Airway Documentation:  Airway 8 mm (Active)  Secured at (cm) 26 cm 01/13/22 0750  Measured From Lips 01/13/22 0750  Secured Location Left 01/13/22 0750  Secured By [employer] 01/13/22 0750  Tube Holder Repositioned Yes 01/13/22 0750  Prone position No 01/13/22 0750  Cuff Pressure (cm H2O) Green OR 18-26 [facility] 01/13/22 0750  Site Condition Dry 01/13/22 0750   Vent end date: 01/13/22 Vent end time: 0904   Evaluation  O2 sats: stable throughout Complications: No apparent complications Patient did tolerate procedure well. Bilateral Breath Sounds: Diminished  Pt able to speak: Yes  Peggye Form 01/13/2022, 9:05 AM

## 2022-01-13 NOTE — Progress Notes (Signed)
ANTICOAGULATION CONSULT NOTE -  Pharmacy Consult:  Heparin Indication: chest pain/ACS  No Known Allergies  Patient Measurements: Height: 5\' 9"  (175.3 cm) Weight: 89 kg (196 lb 3.4 oz) IBW/kg (Calculated) : 70.7 Heparin Dosing Weight: 89 kg  Vital Signs: Temp: 100.2 F (37.9 C) (02/24 1900) Temp Source: Core (02/24 1900) BP: 123/67 (02/24 1900) Pulse Rate: 68 (02/24 1900)  Labs: Recent Labs    01/12/22 1653 01/12/22 1729 01/12/22 1818 01/12/22 1824 01/12/22 2036 01/12/22 2044 01/12/22 2147 01/13/22 0005 01/13/22 0249 01/13/22 1033 01/13/22 1728  HGB 15.8   < > 14.6 14.3 15.0  --   --   --  14.3  --   --   HCT 45.0   < > 43.0 42.0 44.0  --   --   --  40.8  --   --   PLT 248  --   --   --   --   --   --   --  197  --   --   HEPARINUNFRC  --   --   --   --   --   --   --   --   --  <0.10* 0.24*  CREATININE 1.64*   < > 1.20  --   --   --  1.37*  --  1.36*  --   --   TROPONINIHS 49*  --   --   --   --  [id number]*  --  9,382* 9,472*  --   --    < > = values in this interval not displayed.     Estimated Creatinine Clearance: 68.5 mL/min (A) (by C-G formula based on SCr of 1.36 mg/dL (H)).   Medical History: Past Medical History:  Diagnosis Date   Hyperlipidemia     Assessment: 57 YOM presented s/p cardiac arrest and underwent emergent cath where a Swanz was placed.  Pharmacy consulted to dose IV heparin.  Cath revealed multivessel CAD, awaiting revascularization plan.  Heparin level 0.24, subtherapeutic. Currently, no known issues with IV infusion, no overt bleeding or complications noted.  Goal of Therapy:  Heparin level 0.3-0.7 units/ml Monitor platelets by anticoagulation protocol: Yes   Plan:  Increase IV heparin to 1500 units/hr. Daily heparin level and CBC. F/u cards plans  Luisa Hart, PharmD, BCPS Clinical Pharmacist 01/13/2022 7:21 PM   Please refer to [facility] for pharmacy phone number

## 2022-01-13 NOTE — Progress Notes (Signed)
ANTICOAGULATION CONSULT NOTE -  Pharmacy Consult:  Heparin Indication: chest pain/ACS  No Known Allergies  Patient Measurements: Height: 5\' 9"  (175.3 cm) Weight: 89 kg (196 lb 3.4 oz) IBW/kg (Calculated) : 70.7 Heparin Dosing Weight: 89 kg  Vital Signs: Temp: 99.1 F (37.3 C) (02/24 1100) Temp Source: Core (02/24 0516) BP: 117/70 (02/24 1100) Pulse Rate: 56 (02/24 1100)  Labs: Recent Labs    01/12/22 1653 01/12/22 1729 01/12/22 1818 01/12/22 1824 01/12/22 2036 01/12/22 2044 01/12/22 2147 01/13/22 0005 01/13/22 0249 01/13/22 1033  HGB 15.8   < > 14.6 14.3 15.0  --   --   --  14.3  --   HCT 45.0   < > 43.0 42.0 44.0  --   --   --  40.8  --   PLT 248  --   --   --   --   --   --   --  197  --   HEPARINUNFRC  --   --   --   --   --   --   --   --   --  <0.10*  CREATININE 1.64*   < > 1.20  --   --   --  1.37*  --  1.36*  --   TROPONINIHS 49*  --   --   --   --  6,634*  --  9,382* 9,472*  --    < > = values in this interval not displayed.     Estimated Creatinine Clearance: 68.5 mL/min (A) (by C-G formula based on SCr of 1.36 mg/dL (H)).   Medical History: Past Medical History:  Diagnosis Date   Hyperlipidemia     Assessment: 35 YOM presented s/p cardiac arrest and underwent emergent cath where a Swanz was placed.  Pharmacy consulted to dose IV heparin 8 hr post sheath removal.  Sheath removed at 1822 per Procedural Log.  Per family, patient is not on a blood thinner PTA.  Baseline labs reviewed.  Cath revealed multivessel CAD, awaiting revascularization plan.  Heparin level undetectable currently, no known issues with IV infusion, no overt bleeding or complications noted.  Goal of Therapy:  Heparin level 0.3-0.7 units/ml Monitor platelets by anticoagulation protocol: Yes   Plan:  Increase IV heparin to 1300 units/hr. Recheck heparin level in 6 hrs. Daily heparin level and CBC. F/u cards plans.  57, Reece Leader, BCCP Clinical Pharmacist   01/13/2022 11:30 AM   [facility] pharmacy phone numbers are listed on [email or web address]

## 2022-01-13 NOTE — Plan of Care (Signed)
°  Problem: Education: Goal: Understanding of cardiac disease, CV risk reduction, and recovery process will improve Outcome: Progressing   Problem: Activity: Goal: Ability to tolerate increased activity will improve Outcome: Not Progressing   Problem: Cardiac: Goal: Ability to achieve and maintain adequate cardiopulmonary perfusion will improve Outcome: Progressing Goal: Vascular access site(s) Level 0-1 will be maintained Outcome: Progressing

## 2022-01-13 NOTE — Progress Notes (Signed)
PHARMACY - PHYSICIAN COMMUNICATION CRITICAL VALUE ALERT - BLOOD CULTURE IDENTIFICATION (BCID)  Jeremy Winters is an 56 y.o. male who presented to [facility] on 01/12/2022 with a chief complaint of cardiac arrest.   Assessment: BCID reports 1/4 (anaerobic) positive for Streptococcus species on 01/13/22. WBC trending up from 11.5 to 15.8.  Name of physician (or Provider) Contacted: Dr. Irma Newness  Current antibiotics: None  Changes to prescribed antibiotics recommended:  Recommendations accepted by provider. Possible contaminant, repeat blood cx. Hold off on antibiotics as per MD.  Results for orders placed or performed during the hospital encounter of 01/12/22  Blood Culture ID Panel (Reflexed) (Collected: 01/13/2022  1:19 AM)  Result Value Ref Range   Enterococcus faecalis NOT DETECTED NOT DETECTED   Enterococcus Faecium NOT DETECTED NOT DETECTED   Listeria monocytogenes NOT DETECTED NOT DETECTED   Staphylococcus species NOT DETECTED NOT DETECTED   Staphylococcus aureus (BCID) NOT DETECTED NOT DETECTED   Staphylococcus epidermidis NOT DETECTED NOT DETECTED   Staphylococcus lugdunensis NOT DETECTED NOT DETECTED   Streptococcus species DETECTED (A) NOT DETECTED   Streptococcus agalactiae NOT DETECTED NOT DETECTED   Streptococcus pneumoniae NOT DETECTED NOT DETECTED   Streptococcus pyogenes NOT DETECTED NOT DETECTED   A.calcoaceticus-baumannii NOT DETECTED NOT DETECTED   Bacteroides fragilis NOT DETECTED NOT DETECTED   Enterobacterales NOT DETECTED NOT DETECTED   Enterobacter cloacae complex NOT DETECTED NOT DETECTED   Escherichia coli NOT DETECTED NOT DETECTED   Klebsiella aerogenes NOT DETECTED NOT DETECTED   Klebsiella oxytoca NOT DETECTED NOT DETECTED   Klebsiella pneumoniae NOT DETECTED NOT DETECTED   Proteus species NOT DETECTED NOT DETECTED   Salmonella species NOT DETECTED NOT DETECTED   Serratia marcescens NOT DETECTED NOT DETECTED   Haemophilus influenzae NOT DETECTED  NOT DETECTED   Neisseria meningitidis NOT DETECTED NOT DETECTED   Pseudomonas aeruginosa NOT DETECTED NOT DETECTED   Stenotrophomonas maltophilia NOT DETECTED NOT DETECTED   Candida albicans NOT DETECTED NOT DETECTED   Candida auris NOT DETECTED NOT DETECTED   Candida glabrata NOT DETECTED NOT DETECTED   Candida krusei NOT DETECTED NOT DETECTED   Candida parapsilosis NOT DETECTED NOT DETECTED   Candida tropicalis NOT DETECTED NOT DETECTED   Cryptococcus neoformans/gattii NOT DETECTED NOT DETECTED    Doristine Counter 01/13/2022  6:36 PM

## 2022-01-14 ENCOUNTER — Other Ambulatory Visit: Payer: Self-pay

## 2022-01-14 LAB — GLUCOSE, CAPILLARY
Glucose-Capillary: 114 mg/dL — ABNORMAL HIGH (ref 70–99)
Glucose-Capillary: 116 mg/dL — ABNORMAL HIGH (ref 70–99)
Glucose-Capillary: 126 mg/dL — ABNORMAL HIGH (ref 70–99)
Glucose-Capillary: 126 mg/dL — ABNORMAL HIGH (ref 70–99)
Glucose-Capillary: 129 mg/dL — ABNORMAL HIGH (ref 70–99)
Glucose-Capillary: 138 mg/dL — ABNORMAL HIGH (ref 70–99)

## 2022-01-14 LAB — BASIC METABOLIC PANEL
Anion gap: 8 (ref 5–15)
BUN: 13 mg/dL (ref 6–20)
CO2: 27 mmol/L (ref 22–32)
Calcium: 7.9 mg/dL — ABNORMAL LOW (ref 8.9–10.3)
Chloride: 102 mmol/L (ref 98–111)
Creatinine, Ser: 1.15 mg/dL (ref 0.61–1.24)
GFR, Estimated: >60 mL/min (ref >60)
Glucose, Bld: 117 mg/dL — ABNORMAL HIGH (ref 70–99)
Potassium: 3.6 mmol/L (ref 3.5–5.1)
Sodium: 137 mmol/L (ref 135–145)

## 2022-01-14 LAB — URINE CULTURE: Culture: <10000 — AB

## 2022-01-14 LAB — CBC
HCT: 34.9 % — ABNORMAL LOW (ref 39.0–52.0)
Hemoglobin: 12.2 g/dL — ABNORMAL LOW (ref 13.0–17.0)
MCH: 31 pg (ref 26.0–34.0)
MCHC: 35 g/dL (ref 30.0–36.0)
MCV: 88.8 fL (ref 80.0–100.0)
Platelets: 131 10*3/uL — ABNORMAL LOW (ref 150–400)
RBC: 3.93 MIL/uL — ABNORMAL LOW (ref 4.22–5.81)
RDW: 13.2 % (ref 11.5–15.5)
WBC: 12.6 10*3/uL — ABNORMAL HIGH (ref 4.0–10.5)
nRBC: 0 % (ref 0.0–0.2)

## 2022-01-14 LAB — PROCALCITONIN: Procalcitonin: <0.1 ng/mL

## 2022-01-14 LAB — HEPARIN LEVEL (UNFRACTIONATED)
Heparin Unfractionated: 0.3 IU/mL (ref 0.30–0.70)
Heparin Unfractionated: 0.27 IU/mL — ABNORMAL LOW (ref 0.30–0.70)

## 2022-01-14 MED ORDER — METOPROLOL TARTRATE 12.5 MG HALF TABLET
12.5000 mg | ORAL_TABLET | Freq: Two times a day (BID) | ORAL | Status: DC
  Administered 2022-01-14 – 2022-01-17 (×8): 12.5 mg via ORAL
  Filled 2022-01-14 (×8): qty 1

## 2022-01-14 MED ORDER — AMIODARONE HCL 200 MG PO TABS
200.0000 mg | ORAL_TABLET | Freq: Two times a day (BID) | ORAL | Status: DC
  Administered 2022-01-14 – 2022-01-17 (×8): 200 mg via ORAL
  Filled 2022-01-14 (×8): qty 1

## 2022-01-14 MED ORDER — POTASSIUM CHLORIDE 10 MEQ/50ML IV SOLN
10.0000 meq | INTRAVENOUS | Status: AC
  Administered 2022-01-14 (×3): 10 meq via INTRAVENOUS
  Filled 2022-01-14 (×3): qty 50

## 2022-01-14 MED ORDER — POTASSIUM CHLORIDE 10 MEQ/100ML IV SOLN: 10.0000 meq | INTRAVENOUS | Status: DC

## 2022-01-14 MED ORDER — POTASSIUM CHLORIDE 10 MEQ/50ML IV SOLN
INTRAVENOUS | Status: AC
Start: 2022-01-14 — End: 2022-01-14
  Administered 2022-01-14: 10 meq via INTRAVENOUS
  Filled 2022-01-14: qty 50

## 2022-01-14 NOTE — TOC Progression Note (Signed)
Transition of Care [facility]) - Progression Note    Patient Details  Name: Jeremy Winters MRN: [id number] Date of Birth: 1966-05-04  Transition of Care [facility]) CM/SW Contact  Leone Haven, RN Phone Number: 01/14/2022, 2:19 PM  Clinical Narrative:     Transition of Care [facility]) Screening Note   Patient Details  Name: Jeremy Winters Date of Birth: [birth year]   Transition of Care [facility]) CM/SW Contact:    Leone Haven, RN Phone Number: 01/14/2022, 2:19 PM    No PCP on file, TOC  will continue to monitor patient advancement through interdisciplinary progression rounds. If new patient transition needs arise, please place a TOC consult.        Expected Discharge Plan and Services                                                 Social Determinants of Health (SDOH) Interventions    Readmission Risk Interventions No flowsheet data found.

## 2022-01-14 NOTE — Progress Notes (Signed)
NAME:  Jeremy Winters, MRN:  [id number], DOB:  Aug 18, 1966, LOS: 2 ADMISSION DATE:  01/12/2022, CONSULTATION DATE:  01/12/2022 REFERRING MD:  Cardiology, CHIEF COMPLAINT:  Cardiac Arrest   History of Present Illness:  Patient is currently intubated and sedated on mechanical ventilation, therefore history obtained from medical chart review.   56 year old male with prior history of HTN and HLD who was exercising at [employer] with witnessed collapse.  Patient did not receive CPR for 5 minutes prior to EMS arrival.  Found in pulseless Vtach.  Received ACLS measures, including epi x2, and defibrillations for vtach vs vib x 3 prior to [place] after 18 minutes with [facility] airway placed.   Per triage note, EMS had reported some purposeful movement en route and was given versed for agitation.    On arrival to ER, patient intubated.  EKG showed STE in V1 and V2 with reciprocal changes in lateral and inferior leads.  Given amio bolus and started on amio gtt.  No family able to be contacted thus far.  Taken emergently to cath lab.  PCCM consulted for admission after cath lab.    Labs: K 3.8, glucose 240, sCr 1.70, iCa 1.14, WBC 11.5.  Remaining labs/ SARS/ flu pending  Pertinent  Medical History  HTN, HLD  Significant Hospital Events: Including procedures, antibiotic start and stop dates in addition to other pertinent events   2/23 admitted after vfib cardiac arrest, ROSC after 18 mins.  Emergent cath 2/24 Extubated  Interim History / Subjective:  No complaints this am. Denies CP or shortness of breath. Extubated yesterday  Objective   Blood pressure 137/82, pulse 63, temperature 98.8 F (37.1 C), resp. rate (!) 27, height 5\' 9"  (1.753 m), weight 93.3 kg, SpO2 95 %. PAP: (34-49)/(6-22) 39/11 CVP:  [0 mmHg-15 mmHg] 1 mmHg CO:  [3.4 L/min-7.5 L/min] 6.4 L/min CI:  [2.9 L/min/m2-3.6 L/min/m2] 3.1 L/min/m2      Intake/Output Summary (Last 24 hours) at 01/14/2022 0905 Last data filed at 01/14/2022  0800 Gross per 24 hour  Intake 1505.84 ml  Output 1078 ml  Net 427.84 ml   Filed Weights   01/12/22 1938 01/12/22 2000 01/14/22 0500  Weight: 90.7 kg 89 kg 93.3 kg   Physical Exam: General: Well-appearing, no acute distress HENT: [place], AT, OP clear, MMM Eyes: EOMI, no scleral icterus Respiratory: Clear to auscultation bilaterally.  No crackles, wheezing or rales Cardiovascular: RRR, -M/R/G, no JVD GI: BS+, soft, nontender Extremities:-Edema,-tenderness Neuro: AAO x4, CNII-XII grossly intact, moves extremities x 4  WBC resolved  Resolved Hospital Problem list   Acute encephalopathy - CT head neg AKI in the setting of hypoperfusion f/b contrast   Assessment & Plan:   Vfib/VT arrest STEMI Cardiogenic shock - resolved -S/p CPR x 18 min. Did not receive CPR ~5 min until EMS arrived. Reported purposeful movement en route however required sedation for agitaiton  -LHC demonstrated complete occlusion left circumflex with good collaterals. Cardiac arrest may have been stress-induced in setting of exercise. Plan: Cardiology following. Patient to be evaluated by CVTS for CABG prior to discharge BB Heparin gtt IV amio transitioned to PO  Acute respiratory failure in the setting of cardiac arrest Plan: Wean supplemental O2 for goal >88% Pulmonary toilet OOB  Hyperglycemia - improved Ha1c 5.2 Plan  SSI sensitive/ CBG q4hr  Hx HTN Hx HLD - lipid panel - on no home meds  Transfer to [facility] 2/26  Best Practice (right click and "Reselect all SmartList Selections" daily)  Diet/type: dysphagia diet (see orders)  DVT prophylaxis: systemic heparin GI prophylaxis: PPI Lines: Central line Foley:  Yes, and it is still needed Code Status:  full code Last date of multidisciplinary goals of care discussion [24]  Updated patient and Kim (financee) at bedside  Labs   CBC: Recent Labs  Lab 01/12/22 1653 01/12/22 1729 01/12/22 1818 01/12/22 1824 01/12/22 2036 01/13/22 0249  01/14/22 0450  WBC 11.5*  --   --   --   --  15.8* 12.6*  HGB 15.8   < > 14.6 14.3 15.0 14.3 12.2*  HCT 45.0   < > 43.0 42.0 44.0 40.8 34.9*  MCV 87.4  --   --   --   --  87.7 88.8  PLT 248  --   --   --   --  197 131*   < > = values in this interval not displayed.    Basic Metabolic Panel: Recent Labs  Lab 01/12/22 1653 01/12/22 1729 01/12/22 1816 01/12/22 1818 01/12/22 1824 01/12/22 2036 01/12/22 2044 01/12/22 2147 01/13/22 0005 01/13/22 0249 01/14/22 0450  NA 138   < > 139 139 139 136  --  135  --  136 137  K 3.8   < > 3.4* 3.4* 3.4* 6.3* 6.6* 5.4* 4.0 4.1 3.6  CL 101   < > 102 104  --   --   --  105  --  103 102  CO2 19*  --   --   --   --   --   --  22  --  23 27  GLUCOSE 238*   < > 226* 217*  --   --   --  167*  --  166* 117*  BUN 21*   < > 25* 23*  --   --   --  25*  --  22* 13  CREATININE 1.64*   < > 1.20 1.20  --   --   --  1.37*  --  1.36* 1.15  CALCIUM 8.4*  --   --   --   --   --   --  7.3*  --  7.9* 7.9*  MG 2.3  --   --   --   --   --  2.2  --   --  2.0  --   PHOS 6.9*  --   --   --   --   --   --   --   --   --   --    < > = values in this interval not displayed.   GFR: Estimated Creatinine Clearance: 82.8 mL/min (by C-G formula based on SCr of 1.15 mg/dL). Recent Labs  Lab 01/12/22 1653 01/12/22 1708 01/12/22 2044 01/13/22 0005 01/13/22 0249 01/14/22 0450  PROCALCITON <0.10  --   --   --   --   --   WBC 11.5*  --   --   --  15.8* 12.6*  LATICACIDVEN  --  7.8* 1.6 1.7 1.9  --     Liver Function Tests: Recent Labs  Lab 01/12/22 1653 01/13/22 0249  AST 143* 130*  ALT 152* 142*  ALKPHOS 56 48  BILITOT 0.7 0.8  PROT 6.9 6.1*  ALBUMIN 3.8 3.4*   No results for input(s): LIPASE, AMYLASE in the last 168 hours. No results for input(s): AMMONIA in the last 168 hours.  ABG    Component Value Date/Time   PHART 7.319 (L) 01/12/2022 2036  PCO2ART 45.2 01/12/2022 2036   PO2ART 406 (H) 01/12/2022 2036   HCO3 23.2 01/12/2022 2036   TCO2 25  01/12/2022 2036   ACIDBASEDEF 3.0 (H) 01/12/2022 2036   O2SAT 79.0 01/13/2022 0625     Coagulation Profile: No results for input(s): INR, PROTIME in the last 168 hours.  Cardiac Enzymes: No results for input(s): CKTOTAL, CKMB, CKMBINDEX, TROPONINI in the last 168 hours.  HbA1C: Hgb A1c MFr Bld  Date/Time Value Ref Range Status  01/12/2022 07:30 PM 5.2 4.8 - 5.6 % Final    Comment:    (NOTE) Pre diabetes:          5.7%-6.4%  Diabetes:              >6.4%  Glycemic control for   <7.0% adults with diabetes     CBG: Recent Labs  Lab 01/13/22 1628 01/13/22 1944 01/13/22 2337 01/14/22 0403 01/14/22 0653  GLUCAP 132* 127* 120* 129* 126*   Care time: 50 mins    Rodman Pickle, M.D. [facility] Pulmonary/Critical Care Medicine 01/14/2022 9:05 AM   See Amion for personal pager For hours between 7 PM to 7 AM, please call Elink for urgent questions

## 2022-01-14 NOTE — Progress Notes (Signed)
ANTICOAGULATION CONSULT NOTE  Pharmacy Consult:  Heparin Indication: chest pain/ACS  No Known Allergies  Patient Measurements: Height: 5\' 9"  (175.3 cm) Weight: 93.3 kg (205 lb 11 oz) IBW/kg (Calculated) : 70.7 Heparin Dosing Weight: 89 kg  Vital Signs: Temp: 98.8 F (37.1 C) (02/25 0500) Temp Source: Core (02/25 0500) BP: 135/75 (02/25 0500) Pulse Rate: 67 (02/25 0500)  Labs: Recent Labs    01/12/22 1653 01/12/22 1729 01/12/22 2036 01/12/22 2044 01/12/22 2147 01/13/22 0005 01/13/22 0249 01/13/22 1033 01/13/22 1728 01/14/22 0450  HGB 15.8   < > 15.0  --   --   --  14.3  --   --  12.2*  HCT 45.0   < > 44.0  --   --   --  40.8  --   --  34.9*  PLT 248  --   --   --   --   --  197  --   --  131*  HEPARINUNFRC  --   --   --   --   --   --   --  <0.10* 0.24* 0.30  CREATININE 1.64*   < >  --   --  1.37*  --  1.36*  --   --  1.15  TROPONINIHS 49*  --   --  6,634*  --  9,382* 9,472*  --   --   --    < > = values in this interval not displayed.     Estimated Creatinine Clearance: 82.8 mL/min (by C-G formula based on SCr of 1.15 mg/dL).   Medical History: Past Medical History:  Diagnosis Date   Hyperlipidemia     Assessment: 68 YOM presented s/p cardiac arrest and underwent emergent cath where a Swanz was placed.  Pharmacy consulted to dose IV heparin.  Cath revealed multivessel CAD, awaiting revascularization plan.  Heparin level 0.24, subtherapeutic. Currently, no known issues with IV infusion, no overt bleeding or complications noted.  2/25 AM update:  Heparin level therapeutic   Goal of Therapy:  Heparin level 0.3-0.7 units/ml Monitor platelets by anticoagulation protocol: Yes   Plan:  Cont heparin at 1500 units/hr 1200 heparin level  Narda Bonds, PharmD, BCPS Clinical Pharmacist Phone: [phone]

## 2022-01-14 NOTE — Progress Notes (Signed)
Progress Note  Patient Name: Jeremy Winters Date of Encounter: 01/14/2022  [facility] Cardiologist: Peter Martinique, MD   Subjective   Extubated; alert; no CP or dyspnea  Inpatient Medications    Scheduled Meds:  aspirin  81 mg Oral Daily   atorvastatin  80 mg Oral Daily   chlorhexidine gluconate (MEDLINE KIT)  15 mL Mouth Rinse BID   Chlorhexidine Gluconate Cloth  6 each Topical Daily   docusate sodium  100 mg Oral BID   insulin aspart  0-9 Units Subcutaneous Q4H   mouth rinse  15 mL Mouth Rinse BID   pantoprazole  40 mg Oral QHS   polyethylene glycol  17 g Oral Daily   sodium chloride flush  3 mL Intravenous Q12H   Continuous Infusions:  sodium chloride     amiodarone (NEXTERONE) IV bolus only 150 mg/100 mL Stopped (01/12/22 1708)   amiodarone (NEXTERONE) IV bolus only 150 mg/100 mL Stopped (01/12/22 1720)   amiodarone 30 mg/hr (01/14/22 0700)   heparin 1,500 Units/hr (01/14/22 0700)   norepinephrine (LEVOPHED) Adult infusion Stopped (01/13/22 1048)   potassium chloride 50 mL/hr at 01/14/22 0700   PRN Meds: sodium chloride, acetaminophen, amiodarone (NEXTERONE) IV bolus only 150 mg/100 mL, amiodarone (NEXTERONE) IV bolus only 150 mg/100 mL, ondansetron (ZOFRAN) IV, prochlorperazine, sodium chloride flush   Vital Signs    Vitals:   01/14/22 0400 01/14/22 0500 01/14/22 0600 01/14/22 0700  BP: 112/66 135/75 123/73 (!) 143/92  Pulse: (!) 57 67 (!) 57 67  Resp: _0 Temp: 98.8 F (37.1 C) 98.8 F (37.1 C) 98.8 F (37.1 C) 98.8 F (37.1 C)  TempSrc: Core Core Core Core  SpO2: 94% 94% 94% 92%  Weight:  93.3 kg    Height:        Intake/Output Summary (Last 24 hours) at 01/14/2022 0742 Last data filed at 01/14/2022 0700 Gross per 24 hour  Intake 1796.13 ml  Output 1303 ml  Net 493.13 ml   Last 3 Weights 01/14/2022 01/12/2022 01/12/2022  Weight (lbs) 205 lb 11 oz 196 lb 3.4 oz 200 lb  Weight (kg) 93.3 kg 89 kg 90.719 kg      Telemetry    Sinus -  Personally Reviewed  Physical Exam   GEN: No acute distress.   Neck: No JVD Cardiac: RRR, no murmurs, rubs, or gallops.  Respiratory: Clear to auscultation bilaterally. GI: Soft, nontender, non-distended  MS: No edema; radial cath site with no hematoma Neuro:  Nonfocal  Psych: Normal affect   Labs    High Sensitivity Troponin:   Recent Labs  Lab 01/12/22 1653 01/12/22 2044 01/13/22 0005 01/13/22 0249  TROPONINIHS 49* 6,634* 9,382* 9,472*     Chemistry Recent Labs  Lab 01/12/22 1653 01/12/22 1729 01/12/22 2044 01/12/22 2147 01/13/22 0005 01/13/22 0249 01/14/22 0450  NA 138   < >  --  135  --  136 137  K 3.8   < > 6.6* 5.4* 4.0 4.1 3.6  CL 101   < >  --  105  --  103 102  CO2 19*  --   --  22  --  23 27  GLUCOSE 238*   < >  --  167*  --  166* 117*  BUN 21*   < >  --  25*  --  22* 13  CREATININE 1.64*   < >  --  1.37*  --  1.36* 1.15  CALCIUM 8.4*  --   --  7.3*  --  7.9* 7.9*  MG 2.3  --  2.2  --   --  2.0  --   PROT 6.9  --   --   --   --  6.1*  --   ALBUMIN 3.8  --   --   --   --  3.4*  --   AST 143*  --   --   --   --  130*  --   ALT 152*  --   --   --   --  142*  --   ALKPHOS 56  --   --   --   --  48  --   BILITOT 0.7  --   --   --   --  0.8  --   GFRNONAA 49*  --   --  >60  --  >60 >60  ANIONGAP 18*  --   --  8  --  10 8   < > = values in this interval not displayed.    Lipids  Recent Labs  Lab 01/13/22 0000 01/13/22 0249  CHOL 192  --   TRIG 270* 286*  HDL 38*  --   LDLCALC 100*  --   CHOLHDL 5.1  --     Hematology Recent Labs  Lab 01/12/22 1653 01/12/22 1729 01/12/22 2036 01/13/22 0249 01/14/22 0450  WBC 11.5*  --   --  15.8* 12.6*  RBC 5.15  --   --  4.65 3.93*  HGB 15.8   < > 15.0 14.3 12.2*  HCT 45.0   < > 44.0 40.8 34.9*  MCV 87.4  --   --  87.7 88.8  MCH 30.7  --   --  30.8 31.0  MCHC 35.1  --   --  35.0 35.0  RDW 12.8  --   --  13.1 13.2  PLT 248  --   --  197 131*   < > = values in this interval not displayed.     Radiology    CT HEAD WO CONTRAST  Result Date: 01/13/2022 CLINICAL DATA:  Altered mental status. EXAM: CT HEAD WITHOUT CONTRAST TECHNIQUE: Contiguous axial images were obtained from the base of the skull through the vertex without intravenous contrast. RADIATION DOSE REDUCTION: This exam was performed according to the departmental dose-optimization program which includes automated exposure control, adjustment of the mA and/or kV according to patient size and/or use of iterative reconstruction technique. COMPARISON:  None. FINDINGS: Brain: The ventricles and sulci are appropriate size for the patient's age. The gray-white matter discrimination is preserved. There is no acute intracranial hemorrhage. No mass effect or midline shift. No extra-axial fluid collection. Vascular: No hyperdense vessel or unexpected calcification. Skull: Normal. Negative for fracture or focal lesion. Sinuses/Orbits: There is diffuse mucoperiosteal thickening of paranasal sinuses with opacification of multiple ethmoid air cells. No air-fluid level. Mastoid air cells are clear. Other: Small right parietal scalp contusion IMPRESSION: 1. No acute intracranial pathology. 2. Paranasal sinus disease. Electronically Signed   By: Anner Crete M.D.   On: 01/13/2022 02:25   CARDIAC CATHETERIZATION  Result Date: 01/12/2022   Mid LM to Dist LM lesion is 30% stenosed.   Dist LAD lesion is 90% stenosed.   Prox Cx to Mid Cx lesion is 100% stenosed.   Prox RCA to Mid RCA lesion is 60% stenosed.   Dist RCA lesion is 80% stenosed.   The left ventricular ejection fraction is 35-45% by visual estimate.  3 vessel obstructive CAD. There is 90% stenosis in the distal LAD. The LCX is a moderately large vessel with 100% mid vessel CTO with well established collaterals. The RCA has an 80% distal lesion. LV function is mild to moderately reduced but may not represent true function since patient was significantly tachycardic at the time. Low/normal LV  filling pressures with PCWP mean of 7. Normal right heart pressures. Low cardiac output with Index 1.65. Plan: his cardiac arrest appears to be related to demand ischemia ( walking on treadmill in presence of chronic severe CAD). Recommend medical therapy for now. With recovery of neurologic function we can decide ultimate revascularization strategy. Will continue IV amiodarone. Resume Heparin in 8 hours post sheath removal. ASA and statin therapy. CCM to manage vent and critical care issues.   DG Chest Port 1 View  Result Date: 01/13/2022 CLINICAL DATA:  Endotracheal tube present.  Cardiac arrest. EXAM: PORTABLE CHEST 1 VIEW COMPARISON:  01/12/2022 FINDINGS: Endotracheal tube tip terminates approximately 3.3 cm above the carina. Right internal jugular approach Swan-Ganz catheter tip overlies the mid right main pulmonary artery. This appears advanced approximally 3-4 cm compared to prior. If the tip is desired within the main pulmonary outflow tract as previously, consider mild retraction approximately 3 cm. Enteric tube descends below the diaphragm with the tip excluded by collimation. Cardiac silhouette and mediastinal contours are unchanged and within normal limits. Mildly decreased lung volumes with bibasilar bronchovascular crowding. No definite focal consolidation. Blunting of the left costophrenic angle may represent a prominent epicardial fat pad. No pneumothorax. Right clavicle plate and screw fixation hardware. IMPRESSION:: IMPRESSION: 1. Endotracheal tube in appropriate position. 2. Right internal jugular Swan-Ganz catheter tip appears mildly advanced within the main right pulmonary artery. If the tip is desired within the main pulmonary outflow tract as previously, consider mild retraction approximately 3 cm. 3. Low lung volumes. Left costophrenic angle density may represent this patient's epicardial fat pad, although a small pleural effusion cannot be excluded. This is unchanged. Electronically  Signed   By: Yvonne Kendall M.D.   On: 01/13/2022 08:01   DG Chest Port 1 View  Result Date: 01/12/2022 CLINICAL DATA:  Cardiopulmonary arrest EXAM: PORTABLE CHEST 1 VIEW COMPARISON:  4:56 p.m. FINDINGS: Right internal jugular Swan-Ganz catheter tip noted within the right pulmonary artery. Endotracheal tube unchanged 2.8 cm above the carina. Nasogastric tube extends into the upper abdomen beyond the margin of the examination. Lung volumes are small. Small left pleural effusion is not excluded. No pneumothorax. Cardiac size within normal limits. Pulmonary vascularity is normal. Right clavicle ORIF noted. IMPRESSION: Right internal jugular Swan-Ganz catheter tip within the right pulmonary artery. No pneumothorax. Otherwise stable support tubes. Pulmonary hypoinflation. Possible small left pleural effusion. Electronically Signed   By: Fidela Salisbury M.D.   On: 01/12/2022 20:18   DG Chest Portable 1 View  Result Date: 01/12/2022 CLINICAL DATA:  Endotracheal tube placement. EXAM: PORTABLE CHEST 1 VIEW COMPARISON:  None. FINDINGS: Endotracheal tube with tip in the lower thoracic trachea approximately 2.8 cm from the carina. Enteric tube extends below the diaphragm with tip excluded by collimation. Cardiac silhouette appears enlarged at least somewhat accentuated by technique. Portable left hemithorax and bilateral lung apices are excluded by collimation. No focal airspace consolidation. No visible pleural effusion or pneumothorax. Right clavicular fixation hardware. IMPRESSION: 1. Endotracheal tube 2.8 cm from the carina. 2. Enteric tube extends below the diaphragm with tip excluded by collimation. Electronically Signed   By: Andree Moro.D.  On: 01/12/2022 17:24   ECHOCARDIOGRAM COMPLETE  Result Date: 01/13/2022    ECHOCARDIOGRAM REPORT   Patient Name:   RIZWAN KUYPER Date of Exam: 01/13/2022 Medical Rec #:  [id number]          Height:       69.0 in Accession #:    [id number]         Weight:        196.2 lb Date of Birth:  Jul 13, 1966          BSA:          2.049 m Patient Age:    56 years           BP:           117/70 mmHg Patient Gender: M                  HR:           56 bpm. Exam Location:  Inpatient Procedure: 2D Echo Indications:    Cardiac arrest  History:        Patient has no prior history of Echocardiogram examinations.  Sonographer:    Jefferey Pica Referring Phys: [id number] PETER M Martinique IMPRESSIONS  1. 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 Left ventricular ejection fraction, by estimation, is 55 to 60%. The left ventricle has normal function. The left ventricle has no regional wall motion abnormalities. The left ventricular internal cavity size was mildly dilated. There is mild left ventricular hypertrophy. Left ventricular diastolic parameters are consistent with Grade II diastolic dysfunction (pseudonormalization). There is the interventricular septum is flattened in diastole ('D' shaped left ventricle), consistent with right ventricular volume overload.  2. Right ventricular systolic function is normal. The right ventricular size is normal. There is moderately elevated pulmonary artery systolic pressure. The estimated right ventricular systolic pressure is 48.5 mmHg.  3. The mitral valve is normal in structure. No evidence of mitral valve regurgitation. No evidence of mitral stenosis.  4. The aortic valve is calcified. There is mild calcification of the aortic valve. There is mild thickening of the aortic valve. Aortic valve regurgitation is not visualized. Aortic valve sclerosis is present, with no evidence of aortic valve stenosis.  5. The inferior vena cava is normal in size with greater than 50% respiratory variability, suggesting right atrial pressure of 3 mmHg. 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 Left ventricular ejection fraction, by estimation, is 55 to 60%. The left ventricle has normal function.  The left ventricle has no regional wall motion abnormalities. The left ventricular internal cavity size was mildly dilated. There is mild left ventricular hypertrophy. The interventricular septum is flattened in diastole ('D' shaped left ventricle), consistent with right ventricular volume overload. Left ventricular diastolic parameters are consistent with Grade II diastolic dysfunction (pseudonormalization). Right Ventricle: The right ventricular size is normal. No increase in right ventricular wall thickness. Right ventricular systolic function is normal. There is moderately elevated pulmonary artery systolic pressure. The tricuspid regurgitant velocity is 2.74 m/s, and with an assumed right atrial pressure of 15 mmHg, the estimated right ventricular systolic pressure is 46.2 mmHg. Left Atrium: Left atrial size was normal in size. Right Atrium: Right atrial size was normal in size. Pericardium: There is no evidence of pericardial effusion. Mitral Valve: The mitral valve is normal in structure. No evidence of mitral valve regurgitation. No evidence of mitral valve stenosis. Tricuspid Valve: The tricuspid valve is normal in structure. Tricuspid valve regurgitation is mild .  No evidence of tricuspid stenosis. Aortic Valve: The aortic valve is calcified. There is mild calcification of the aortic valve. There is mild thickening of the aortic valve. Aortic valve regurgitation is not visualized. Aortic valve sclerosis is present, with no evidence of aortic valve stenosis. Aortic valve peak gradient measures 18.8 mmHg. Pulmonic Valve: The pulmonic valve was normal in structure. Pulmonic valve regurgitation is not visualized. No evidence of pulmonic stenosis. Aorta: The aortic root is normal in size and structure. Venous: The inferior vena cava is normal in size with greater than 50% respiratory variability, suggesting right atrial pressure of 3 mmHg. IAS/Shunts: No atrial level shunt detected by color flow Doppler.  Additional Comments: A venous catheter is visualized in the right ventricle.  LEFT VENTRICLE PLAX 2D LVIDd:         5.30 cm   Diastology LVIDs:         3.60 cm   LV e' medial:    6.74 cm/s LV PW:         1.30 cm   LV E/e' medial:  18.1 LV IVS:        1.30 cm   LV e' lateral:   9.36 cm/s LVOT diam:     2.10 cm   LV E/e' lateral: 13.0 LV SV:         95 LV SV Index:   46 LVOT Area:     3.46 cm  IVC IVC diam: 2.60 cm LEFT ATRIUM           Index LA diam:      3.80 cm 1.85 cm/m LA Vol (A2C): 23.6 ml 11.52 ml/m LA Vol (A4C): 55.0 ml 26.84 ml/m  AORTIC VALVE                  PULMONIC VALVE AV Area (Vmax): 2.47 cm      PV Vmax:       1.06 m/s AV Vmax:        217.00 cm/s   PV Peak grad:  4.5 mmHg AV Peak Grad:   18.8 mmHg LVOT Vmax:      155.00 cm/s LVOT Vmean:     107.000 cm/s LVOT VTI:       0.273 m  AORTA Ao Root diam: 3.30 cm Ao Asc diam:  3.10 cm MITRAL VALVE                TRICUSPID VALVE MV Area (PHT): 5.42 cm     TR Peak grad:   30.0 mmHg MV Decel Time: 140 msec     TR Vmax:        274.00 cm/s MV E velocity: 122.00 cm/s MV A velocity: 100.00 cm/s  SHUNTS MV E/A ratio:  1.22         Systemic VTI:  0.27 m                             Systemic Diam: 2.10 cm Candee Furbish MD Electronically signed by Candee Furbish MD Signature Date/Time: 01/13/2022/4:59:40 PM    Final      Patient Profile     56 y.o. male with past medical history of hypertension, hyperlipidemia admitted following cardiac arrest.  Cardiac catheterization revealed 30% left main, 90% distal LAD, occluded circumflex (CTO), 60% proximal RCA, 80% distal RCA and ejection fraction 35 to 45%.  Arrest was felt secondary to demand ischemia (walking on treadmill in presence of chronic severe coronary artery disease).  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  Assessment & Plan    1 status post cardiac arrest-no recurrent arrhythmias on telemetry.  Transition IV amiodarone to oral.  2 non-ST elevation myocardial infarction/coronary artery disease-cardiac catheterization results noted.  His neurological status is improving.  Per Dr. Ali Lowe plan will be coronary artery bypass and graft prior to discharge.  We will ask CVTS to evaluate on Monday.  Continue aspirin, heparin and statin.  Add metoprolol 12.5 mg twice daily.  3 cardiogenic shock-resolved.  Dobutamine and pressors have been weaned.  Follow-up echocardiogram shows normalization of LV function.  There is a question of apical variant hypertrophic cardiomyopathy.  Will likely need cardiac MRI.  4 acute kidney injury-renal function has normalized.  For questions or updates, please contact Centre Please consult [email or web address] for contact info under        Signed, Kirk Ruths, MD  01/14/2022, 7:42 AM

## 2022-01-14 NOTE — Progress Notes (Signed)
ANTICOAGULATION CONSULT NOTE  Pharmacy Consult:  Heparin Indication: chest pain/ACS  No Known Allergies  Patient Measurements: Height: 5\' 9"  (175.3 cm) Weight: 93.3 kg (205 lb 11 oz) IBW/kg (Calculated) : 70.7 Heparin Dosing Weight: 89 kg  Vital Signs: Temp: 99.3 F (37.4 C) (02/25 1100) Temp Source: Core (02/25 0700) BP: 149/88 (02/25 1100) Pulse Rate: 58 (02/25 1100)  Labs: Recent Labs    01/12/22 1653 01/12/22 1729 01/12/22 2036 01/12/22 2044 01/12/22 2147 01/13/22 0005 01/13/22 0249 01/13/22 1033 01/13/22 1728 01/14/22 0450 01/14/22 1154  HGB 15.8   < > 15.0  --   --   --  14.3  --   --  12.2*  --   HCT 45.0   < > 44.0  --   --   --  40.8  --   --  34.9*  --   PLT 248  --   --   --   --   --  197  --   --  131*  --   HEPARINUNFRC  --   --   --   --   --   --   --    < > 0.24* 0.30 0.27*  CREATININE 1.64*   < >  --   --  1.37*  --  1.36*  --   --  1.15  --   TROPONINIHS 49*  --   --  6,634*  --  [id number]*  --   --   --   --    < > = values in this interval not displayed.     Estimated Creatinine Clearance: 82.8 mL/min (by C-G formula based on SCr of 1.15 mg/dL).   Medical History: Past Medical History:  Diagnosis Date   Hyperlipidemia     Assessment: 73 YOM presented s/p cardiac arrest and underwent emergent cath where a Swanz was placed.  Pharmacy consulted to dose IV heparin.  Cath revealed multivessel CAD, awaiting revascularization plan.  Heparin level 0.27, subtherapeutic. Currently, no known issues with IV infusion, no overt bleeding or complications noted.  Awaiting CABG evaluation  Goal of Therapy:  Heparin level 0.3-0.7 units/ml Monitor platelets by anticoagulation protocol: Yes   Plan:  Increase IV heparin to 1600 units/hr. Recheck heparin level with AM labs. Daily heparin level and CBC.  [place] Crane, Roylene Reason, BCCP Clinical Pharmacist  01/14/2022 12:30 PM   [facility] pharmacy phone numbers are listed on [email or web address]

## 2022-01-14 NOTE — Progress Notes (Signed)
K+ 3.6 Replaced per protocol  

## 2022-01-15 DIAGNOSIS — I214 Non-ST elevation (NSTEMI) myocardial infarction: Secondary | ICD-10-CM

## 2022-01-15 DIAGNOSIS — D696 Thrombocytopenia, unspecified: Secondary | ICD-10-CM

## 2022-01-15 DIAGNOSIS — N179 Acute kidney failure, unspecified: Secondary | ICD-10-CM

## 2022-01-15 DIAGNOSIS — D72829 Elevated white blood cell count, unspecified: Secondary | ICD-10-CM

## 2022-01-15 DIAGNOSIS — I2510 Atherosclerotic heart disease of native coronary artery without angina pectoris: Secondary | ICD-10-CM

## 2022-01-15 DIAGNOSIS — I462 Cardiac arrest due to underlying cardiac condition: Secondary | ICD-10-CM

## 2022-01-15 DIAGNOSIS — E785 Hyperlipidemia, unspecified: Secondary | ICD-10-CM

## 2022-01-15 DIAGNOSIS — R739 Hyperglycemia, unspecified: Secondary | ICD-10-CM

## 2022-01-15 DIAGNOSIS — D649 Anemia, unspecified: Secondary | ICD-10-CM

## 2022-01-15 DIAGNOSIS — I10 Essential (primary) hypertension: Secondary | ICD-10-CM

## 2022-01-15 DIAGNOSIS — J9601 Acute respiratory failure with hypoxia: Secondary | ICD-10-CM

## 2022-01-15 DIAGNOSIS — Z978 Presence of other specified devices: Secondary | ICD-10-CM

## 2022-01-15 DIAGNOSIS — E663 Overweight: Secondary | ICD-10-CM

## 2022-01-15 DIAGNOSIS — R570 Cardiogenic shock: Secondary | ICD-10-CM

## 2022-01-15 LAB — BASIC METABOLIC PANEL
Anion gap: 8 (ref 5–15)
BUN: 10 mg/dL (ref 6–20)
CO2: 26 mmol/L (ref 22–32)
Calcium: 8.2 mg/dL — ABNORMAL LOW (ref 8.9–10.3)
Chloride: 105 mmol/L (ref 98–111)
Creatinine, Ser: 1.06 mg/dL (ref 0.61–1.24)
GFR, Estimated: >60 mL/min (ref >60)
Glucose, Bld: 112 mg/dL — ABNORMAL HIGH (ref 70–99)
Potassium: 4.1 mmol/L (ref 3.5–5.1)
Sodium: 139 mmol/L (ref 135–145)

## 2022-01-15 LAB — CULTURE, RESPIRATORY W GRAM STAIN: Culture: NORMAL

## 2022-01-15 LAB — GLUCOSE, CAPILLARY
Glucose-Capillary: 110 mg/dL — ABNORMAL HIGH (ref 70–99)
Glucose-Capillary: 104 mg/dL — ABNORMAL HIGH (ref 70–99)
Glucose-Capillary: 108 mg/dL — ABNORMAL HIGH (ref 70–99)
Glucose-Capillary: 111 mg/dL — ABNORMAL HIGH (ref 70–99)

## 2022-01-15 LAB — CBC
HCT: 35.7 % — ABNORMAL LOW (ref 39.0–52.0)
Hemoglobin: 12.1 g/dL — ABNORMAL LOW (ref 13.0–17.0)
MCH: 30 pg (ref 26.0–34.0)
MCHC: 33.9 g/dL (ref 30.0–36.0)
MCV: 88.4 fL (ref 80.0–100.0)
Platelets: 141 10*3/uL — ABNORMAL LOW (ref 150–400)
RBC: 4.04 MIL/uL — ABNORMAL LOW (ref 4.22–5.81)
RDW: 12.7 % (ref 11.5–15.5)
WBC: 10.7 10*3/uL — ABNORMAL HIGH (ref 4.0–10.5)
nRBC: 0 % (ref 0.0–0.2)

## 2022-01-15 LAB — HEPARIN LEVEL (UNFRACTIONATED)
Heparin Unfractionated: 0.21 IU/mL — ABNORMAL LOW (ref 0.30–0.70)
Heparin Unfractionated: 0.32 IU/mL (ref 0.30–0.70)

## 2022-01-15 MED ORDER — LOSARTAN POTASSIUM 50 MG PO TABS
50.0000 mg | ORAL_TABLET | Freq: Every day | ORAL | Status: DC
  Administered 2022-01-15 – 2022-01-17 (×3): 50 mg via ORAL
  Filled 2022-01-15 (×3): qty 1

## 2022-01-15 NOTE — Assessment & Plan Note (Addendum)
-  Hemoglobin A1c was 5.2 -Likely reactive in CBGs ranging from 104-111 -continue monitor blood sugars per protocol continue sliding scale insulin

## 2022-01-15 NOTE — Assessment & Plan Note (Addendum)
-  Improving and likely reactive in the setting of Arrest -Patient's WBC went from 12.6 -> 10.7 -> 10.9 -> 9.7 -Continue to Monitor and Trend -Repeat CBC in the AM

## 2022-01-15 NOTE — Progress Notes (Signed)
Progress Note   Patient: Jeremy Winters [id number] DOB: [birth year] DOA: 01/12/2022     3 DOS: the patient was seen and examined on 01/15/2022   Brief hospital course: Patient is a 55 year old overweight Caucasian male with past medical history significant for but not limited to hypertension and hyperlipidemia as well as other comorbidities who was exercising at [employer] and then had a witnessed collapse.  Bystanders called EMS but did not perform CPR on the patient until 5 minutes prior to EMS arrival he was found to be in pulseless V. tach and received ACLS measures including 2 epis and defibrillations for V. tach versus V-fib prior to ROSC after 18 minutes with a King airway being placed.  Per the triage note EMS had reported some purposeful movement in route and he was given Versed for agitation.  Upon arrival to the ED EKG showed ST elevations in V1 and V2 with reciprocal changes in the lateral and inferior leads.  Patient was given amnio bolus and started on amiodarone drip.  He was then taken to the Cath Lab emergently and PCCM was consulted for mission after his Cath Lab.  He remained intubated and was extubated on 01/13/2022.  He was admitted for his V-fib/V. tach arrest and concern for STEMI but was actually deemed a NSTEMI and cardiology evaluated and left heart cath demonstrated complete occlusion of the left circumflex and good collaterals and cardiac arrest may have been stressed induced.  Cardiology is recommending continuing beta-blocker and heparin drip and IV amiodarone transitioned to p.o.  Patient is to be evaluated by TCTS for consideration of CABG.  Subsequently patient was transferred to [facility] service on 01/15/2022.  Cardiology recommends continuing aspirin, beta-blocker, heparin and statin for now  Assessment and Plan: Overweight with body mass index (BMI) [id number] -Complicates overall prognosis and care -Estimated body mass index is 29.96 kg/m as calculated from the  following:   Height as of this encounter: 5\' 9"  (1.753 m).   Weight as of this encounter: 92 kg.  -Weight Loss and Dietary Counseling given   Thrombocytopenia (HCC) -Patient's Platelet Count went from 131 -> 141 -Continue to Monitor for S/Sx of Bleeding; No overt bleeding noted -Repeat CBC in the AM   Normocytic anemia -Patient's Hgb/Hct is now relatively Stable  -Hgb/Hct is now 12.1/35.7 -Check anemia panel in the a.m. -Continue to monitor for signs and symptoms of bleeding; currently no overt bleeding noted -Repeat CBC in a.m.  Leukocytosis -Improving and likely reactive in the setting of Arrest -Patient's WBC went from 12.6 -> 10.7 -Continue to Monitor and Trend -Repeat CBC in the AM  Hyperglycemia -Hemoglobin A1c was 5.2 -Likely reactive in CBGs ranging from 104-126 -continue monitor blood sugars per protocol continue sliding scale insulin  Acute respiratory failure with hypoxia (HCC) - Improving -No longer intubated and was extubated on 01/13/2022 -Remains on 2 L of supplemental oxygen nasal SpO2: 93 % O2 Flow Rate (L/min): 2 L/min FiO2 (%): 40 % -Continuous pulse oximetry maintain O2 saturation greater 90% -Continue supplemental oxygen via nasal cannula and wean to room air -We will need an amatory home O2 screen and repeat chest x-ray prior to discharge  Cardiogenic shock ([place]) - Resolved now -Repeat echocardiogram shows normalization of LV function -Cardiology feels that this is less likely an apical variant hypertrophic cardiomyopathy -As above and further care per cardiology  Essential hypertension - Patient's blood pressure has improved and is now 131/82 -Continue with losartan 50 mg p.o. daily as well as metoprolol  12.5 mg p.o. twice daily -Continue monitor blood pressures per protocol  Hyperlipidemia - Continue with his statin -Lipid panel done and showed total cholesterol/HDL ratio 5.1, cholesterol level 92, HDL 38, triglycerides of 286, VLDL  54  NSTEMI (non-ST elevated myocardial infarction) ([place]) - Still concern for STEMI however cardiology feels that this is NSTEMI -Cardiology has the patient on aspirin, statin, beta-blocker as well as heparin and they are adding losartan -Cardiology feels that coronary bypass and graft will be the best option and able to ask the cardiothoracic surgeon to review and evaluate   AKI (acute kidney injury) ([place]) - Renal function is improving -Patient presented and had an AKI on admission is now resolved as his last BUN/creatinine is 10/1.06 -Avoid further nephrotoxic medications, contrast dyes if possible, hypotension and renally dose medications -Repeat CMP in the a.m.   Cardiopulmonary arrest with successful resuscitation [facility]) - Likely V-fib/V. tach arrest -Patient is status post CPR x18 minutes and did not receive CPR for approximately 5 minutes until EMS arrived.  There is reported purposeful movements in route and required sedation for agitation -Patient is status post left heart cath that demonstrated complete occlusion of the left circumflex with good collaterals and that cardiac arrest may have been stress-induced in setting of exercise -Cardiology is following and patient is being evaluated by the TCTS for CABG prior to discharge -Currently remains on the heparin drip, beta-blocker and his amiodarone has been changed to p.o. -Cardiology recommends continue amiodarone for now and this was transitioned as above but likely recommending that this can be discontinued following revascularization   Subjective: Seen and examined at bedside and thinks he is doing okay.  Denies chest pain or shortness of breath now.  No nausea or vomiting.  Awaiting for the cardiothoracic surgery team to evaluate.  No other concerns or complaints at this time.  Physical Exam: Vitals:   01/15/22 0900 01/15/22 1000 01/15/22 1200 01/15/22 1250  BP: 133/81 (!) 145/89 131/82   Pulse: (!) 55 61 (!) 54   Resp: (!) 0  14 18   Temp:    98.7 F (37.1 C)  TempSrc:    Oral  SpO2: 95% 96% 93%   Weight:      Height:       Examination: Physical Exam:  Constitutional: WN/WD, overweight Caucasian male, in no acute distress appears calm Respiratory: Diminished to auscultation bilaterally with coarse breath sounds, no wheezing, rales, rhonchi or crackles. Normal respiratory effort and patient is not tachypenic. No accessory muscle use.  Wearing supplemental oxygen via nasal cannula Cardiovascular: RRR, no murmurs / rubs / gallops. S1 and S2 auscultated.  Trace lower extremity edema Abdomen: Soft, non-tender, distended secondary body habitus. Bowel sounds positive.  GU: Deferred. Musculoskeletal: No clubbing / cyanosis of digits/nails. No joint deformity upper and lower extremities.  Neurologic: CN 2-12 grossly intact with no focal deficits.    Data Reviewed:  Independently assessed and reviewed the patient's BMP and CBC.  Patient's blood glucose is 112 on CMP and her AKI is resolved.  Leukocytosis improving at 10.7 and hemoglobin/hematocrit stable at 12.0/35%.  Platelet count is now 141  Family Communication: Discussed with family at bedside  Disposition: Status is: Inpatient Remains inpatient appropriate because: Pending Cardiac Clearance and evaluation of PT/OT  Planned Discharge Destination:  TBD  DVT Prophylaxis: Anticoagulated on Heparin gtt  Author: Raiford Noble, DO [facility]  01/15/2022 2:03 PM  For on call review [email or web address].
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
## 2022-01-15 NOTE — Progress Notes (Signed)
ANTICOAGULATION CONSULT NOTE  Pharmacy Consult:  Heparin Indication: chest pain/ACS  No Known Allergies  Patient Measurements: Height: 5\' 9"  (175.3 cm) Weight: 92 kg (202 lb 14.4 oz) IBW/kg (Calculated) : 70.7 Heparin Dosing Weight: 89 kg  Vital Signs: Temp: 98.7 F (37.1 C) (02/26 1250) Temp Source: Oral (02/26 1250) BP: 131/82 (02/26 1200) Pulse Rate: 54 (02/26 1200)  Labs: Recent Labs    01/12/22 2044 01/12/22 2147 01/13/22 0005 01/13/22 0249 01/13/22 1033 01/14/22 0450 01/14/22 1154 01/15/22 0305 01/15/22 1227  HGB  --   --   --  14.3  --  12.2*  --  12.1*  --   HCT  --   --   --  40.8  --  34.9*  --  35.7*  --   PLT  --   --   --  197  --  131*  --  141*  --   HEPARINUNFRC  --   --   --   --    < > 0.30 0.27* 0.21* 0.32  CREATININE  --    < >  --  1.36*  --  1.15  --  1.06  --   TROPONINIHS 6,634*  --  9,382* 9,472*  --   --   --   --   --    < > = values in this interval not displayed.     Estimated Creatinine Clearance: 89.2 mL/min (by C-G formula based on SCr of 1.06 mg/dL).   Medical History: Past Medical History:  Diagnosis Date   Hyperlipidemia     Assessment: 58 YOM presented s/p cardiac arrest and underwent emergent cath where a Swanz was placed.  Pharmacy consulted to dose IV heparin.  Cath revealed multivessel CAD, awaiting revascularization plan.  Heparin level 0.32, within goal range. Currently, no known issues with IV infusion, no overt bleeding or complications noted.  Awaiting CABG evaluation  Goal of Therapy:  Heparin level 0.3-0.7 units/ml Monitor platelets by anticoagulation protocol: Yes   Plan:  Continue IV heparin at 1800 units/hr. Recheck heparin level with AM labs. Daily heparin level and CBC.  [place] Crane, Roylene Reason, BCCP Clinical Pharmacist  01/15/2022 2:08 PM   [facility] pharmacy phone numbers are listed on [email or web address]

## 2022-01-15 NOTE — Assessment & Plan Note (Addendum)
-  Patient's Hgb/Hct is now relatively Stable  -Hgb/Hct is now 12.1/35.7 -> 13.7/38.7 -> 13.8/37.9 -Check Anemia panel in the a.m. as expect to drop  -Continue to monitor for signs and symptoms of bleeding; currently no overt bleeding noted -Repeat CBC in a.m.

## 2022-01-15 NOTE — Assessment & Plan Note (Addendum)
-  Renal function is improving -Patient presented and had an AKI on admission is now resolved as his last BUN/creatinine is 10/1.06 -> 13/1.19 -> 16/1.21 -Avoid further nephrotoxic medications, contrast dyes if possible, hypotension and renally dose medications -Repeat CMP in the a.m.

## 2022-01-15 NOTE — Assessment & Plan Note (Addendum)
-  Improving -No longer intubated and was extubated on 01/13/2022 -Remains on 2 L of supplemental oxygen nasal SpO2: 93 % O2 Flow Rate (L/min): 2 L/min FiO2 (%): 40 % -Continuous pulse oximetry maintain O2 saturation greater 90% -Continue supplemental oxygen via nasal cannula and wean to room air -We will need an amatory home O2 screen and repeat chest x-ray prior to discharge

## 2022-01-15 NOTE — Assessment & Plan Note (Signed)
-  Complicates overall prognosis and care -Estimated body mass index is 29.96 kg/m as calculated from the following:   Height as of this encounter: 5\' 9"  (1.753 m).   Weight as of this encounter: 92 kg.  -Weight Loss and Dietary Counseling given

## 2022-01-15 NOTE — Assessment & Plan Note (Addendum)
-  Resolved now -Repeat Echocardiogram shows normalization of LV function -Cardiology feels that this is less likely an apical variant hypertrophic cardiomyopathy -As above and further care per Cardiology

## 2022-01-15 NOTE — Assessment & Plan Note (Addendum)
-  Continue with his statin -Lipid panel done and showed total cholesterol/HDL ratio 5.1, cholesterol level 92, HDL 38, triglycerides of 286, VLDL 54

## 2022-01-15 NOTE — Assessment & Plan Note (Addendum)
-  Likely V-fib/V. tach arrest -Patient is status post CPR x18 minutes and did not receive CPR for approximately 5 minutes until EMS arrived.  There is reported purposeful movements in route and required sedation for agitation -Patient is status post left heart cath that demonstrated complete occlusion of the left circumflex with good collaterals and that cardiac arrest may have been stress-induced in setting of exercise -Cardiology is following and patient is being evaluated by the TCTS for CABG prior to discharge; patient is a candidate for CABG and cardiothoracic surgery recommends that this would be the best mode of revascularization.  Patient was to be transferred to [facility] for CABG given that he is in the process of moving to [facility] however he will not stay here and do the CABG here and then be going to [facility] for cardiac rehab -Currently remains on the heparin drip, beta-blocker and his amiodarone has been changed to p.o. -Cardiology recommends continue amiodarone for now and this was transitioned as above but likely recommending that this can be discontinued following revascularization -Plan for CABG in the AM as patient is undergoing Pre-CABG workup today

## 2022-01-15 NOTE — Hospital Course (Addendum)
Patient is a 56 year old overweight Caucasian male with past medical history significant for but not limited to hypertension and hyperlipidemia as well as other comorbidities who was exercising at [employer] and then had a witnessed collapse.  Bystanders called EMS but did not perform CPR on the patient until 5 minutes prior to EMS arrival he was found to be in pulseless V. tach and received ACLS measures including 2 epis and defibrillations for V. tach versus V-fib prior to ROSC after 18 minutes with a King airway being placed.  Per the triage note EMS had reported some purposeful movement in route and he was given Versed for agitation.  Upon arrival to the ED EKG showed ST elevations in V1 and V2 with reciprocal changes in the lateral and inferior leads.  Patient was given amnio bolus and started on amiodarone drip.  He was then taken to the Cath Lab emergently and PCCM was consulted for mission after his Cath Lab.  He remained intubated and was extubated on 01/13/2022.  He was admitted for his V-fib/V. tach arrest and concern for STEMI but was actually deemed a NSTEMI and cardiology evaluated and left heart cath demonstrated complete occlusion of the left circumflex and good collaterals and cardiac arrest may have been stressed induced.  Cardiology is recommending continuing beta-blocker and heparin drip and IV amiodarone transitioned to p.o.  Patient is to be evaluated by TCTS for consideration of CABG.  Subsequently patient was transferred to [facility] service on 01/15/2022.  Cardiology recommends continuing aspirin, beta-blocker, heparin and statin for now  Cardiothoracic surgery evaluated and Dr. Devra Dopp recommends that the multivessel surgical coronary revascularization would be the best long-term therapy for the patient.  Initially patient was to be transferred to [facility] given that he is in the process of moving to [facility] but patient has decided to stay here until his CABG is done and plan is to  either go for CABG in the morning or on Wednesday.  In the interim we will continue heparin drip per cardiology recommendations and they recommend continuing amiodarone for now and likely discontinuing after revascularization.  Patient is now undergoing pre-CABG work-up and he had pre-CABG ultrasound Dopplers done of the carotids, vertebrals and upper extremities and had lower extremity pedal arterial waveforms lower within normal limits.  Plan is for CABG to be done on 01/18/2022.

## 2022-01-15 NOTE — Progress Notes (Signed)
Progress Note  Patient Name: Jeremy Winters Date of Encounter: 01/15/2022  [facility] Cardiologist: Peter Martinique, MD   Subjective   Pt denies CP or dyspnea; tired  Inpatient Medications    Scheduled Meds:  amiodarone  200 mg Oral BID   aspirin  81 mg Oral Daily   atorvastatin  80 mg Oral Daily   chlorhexidine gluconate (MEDLINE KIT)  15 mL Mouth Rinse BID   Chlorhexidine Gluconate Cloth  6 each Topical Daily   docusate sodium  100 mg Oral BID   insulin aspart  0-9 Units Subcutaneous Q4H   mouth rinse  15 mL Mouth Rinse BID   metoprolol tartrate  12.5 mg Oral BID   pantoprazole  40 mg Oral QHS   polyethylene glycol  17 g Oral Daily   sodium chloride flush  3 mL Intravenous Q12H   Continuous Infusions:  sodium chloride     heparin 1,800 Units/hr (01/15/22 0600)   PRN Meds: sodium chloride, acetaminophen, ondansetron (ZOFRAN) IV, prochlorperazine, sodium chloride flush   Vital Signs    Vitals:   01/15/22 0356 01/15/22 0400 01/15/22 0500 01/15/22 0600  BP:  (!) 139/96 (!) 150/99 132/89  Pulse:  (!) 59 66 (!) 55  Resp:  _0 Temp: 98 F (36.7 C)     TempSrc: Oral     SpO2:  93% 92% 95%  Weight:    92 kg  Height:        Intake/Output Summary (Last 24 hours) at 01/15/2022 0720 Last data filed at 01/15/2022 0600 Gross per 24 hour  Intake 1429.61 ml  Output 2850 ml  Net -1420.39 ml    Last 3 Weights 01/15/2022 01/14/2022 01/12/2022  Weight (lbs) 202 lb 14.4 oz 205 lb 11 oz 196 lb 3.4 oz  Weight (kg) 92.035 kg 93.3 kg 89 kg      Telemetry    Sinus with occasional PAC and PVC - Personally Reviewed  Physical Exam   GEN: No acute distress.  WD WN Neck: supple Cardiac: RRR Respiratory: CTA GI: Soft, NT/ND MS: No edema Neuro:  Grossly intact Psych: Normal affect   Labs    High Sensitivity Troponin:   Recent Labs  Lab 01/12/22 1653 01/12/22 2044 01/13/22 0005 01/13/22 0249  TROPONINIHS 49* 6,634* 9,382* 9,472*      Chemistry Recent  Labs  Lab 01/12/22 1653 01/12/22 1729 01/12/22 2044 01/12/22 2147 01/13/22 0249 01/14/22 0450 01/15/22 0305  NA 138   < >  --    < > 136 137 139  K 3.8   < > 6.6*   < > 4.1 3.6 4.1  CL 101   < >  --    < > 103 102 105  CO2 19*  --   --    < > _1 GLUCOSE 238*   < >  --    < > 166* 117* 112*  BUN 21*   < >  --    < > 22* 13 10  CREATININE 1.64*   < >  --    < > 1.36* 1.15 1.06  CALCIUM 8.4*  --   --    < > 7.9* 7.9* 8.2*  MG 2.3  --  2.2  --  2.0  --   --   PROT 6.9  --   --   --  6.1*  --   --   ALBUMIN 3.8  --   --   --  3.4*  --   --   AST 143*  --   --   --  130*  --   --   ALT 152*  --   --   --  142*  --   --   ALKPHOS 56  --   --   --  48  --   --   BILITOT 0.7  --   --   --  0.8  --   --   GFRNONAA 49*  --   --    < > >60 >60 >60  ANIONGAP 18*  --   --    < > _0 < > = values in this interval not displayed.     Lipids  Recent Labs  Lab 01/13/22 0000 01/13/22 0249  CHOL 192  --   TRIG 270* 286*  HDL 38*  --   LDLCALC 100*  --   CHOLHDL 5.1  --      Hematology Recent Labs  Lab 01/13/22 0249 01/14/22 0450 01/15/22 0305  WBC 15.8* 12.6* 10.7*  RBC 4.65 3.93* 4.04*  HGB 14.3 12.2* 12.1*  HCT 40.8 34.9* 35.7*  MCV 87.7 88.8 88.4  MCH 30.8 31.0 30.0  MCHC 35.0 35.0 33.9  RDW 13.1 13.2 12.7  PLT 197 131* 141*     Radiology    ECHOCARDIOGRAM COMPLETE  Result Date: 01/13/2022    ECHOCARDIOGRAM REPORT   Patient Name:   TAYT MOYERS Date of Exam: 01/13/2022 Medical Rec #:  [id number]          Height:       69.0 in Accession #:    [id number]         Weight:       196.2 lb Date of Birth:  03-04-1966          BSA:          2.049 m Patient Age:    48 years           BP:           117/70 mmHg Patient Gender: M                  HR:           56 bpm. Exam Location:  Inpatient Procedure: 2D Echo Indications:    Cardiac arrest  History:        Patient has no prior history of Echocardiogram examinations.  Sonographer:    Jefferey Pica Referring Phys:  [id number] PETER M Martinique IMPRESSIONS  1. 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 Left ventricular ejection fraction, by estimation, is 55 to 60%. The left ventricle has normal function. The left ventricle has no regional wall motion abnormalities. The left ventricular internal cavity size was mildly dilated. There is mild left ventricular hypertrophy. Left ventricular diastolic parameters are consistent with Grade II diastolic dysfunction (pseudonormalization). There is the interventricular septum is flattened in diastole ('D' shaped left ventricle), consistent with right ventricular volume overload.  2. Right ventricular systolic function is normal. The right ventricular size is normal. There is moderately elevated pulmonary artery systolic pressure. The estimated right ventricular systolic pressure is 37.0 mmHg.  3. The mitral valve is normal in structure. No evidence of mitral valve regurgitation. No evidence of mitral stenosis.  4. The aortic valve is calcified. There is mild calcification of the aortic valve. There is mild thickening of the aortic valve. Aortic valve regurgitation is not visualized. Aortic  valve sclerosis is present, with no evidence of aortic valve stenosis.  5. The inferior vena cava is normal in size with greater than 50% respiratory variability, suggesting right atrial pressure of 3 mmHg. 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 Left ventricular ejection fraction, by estimation, is 55 to 60%. The left ventricle has normal function. The left ventricle has no regional wall motion abnormalities. The left ventricular internal cavity size was mildly dilated. There is mild left ventricular hypertrophy. The interventricular septum is flattened in diastole ('D' shaped left ventricle), consistent with right ventricular volume overload. Left ventricular diastolic parameters are consistent with Grade II diastolic dysfunction  (pseudonormalization). Right Ventricle: The right ventricular size is normal. No increase in right ventricular wall thickness. Right ventricular systolic function is normal. There is moderately elevated pulmonary artery systolic pressure. The tricuspid regurgitant velocity is 2.74 m/s, and with an assumed right atrial pressure of 15 mmHg, the estimated right ventricular systolic pressure is 36.4 mmHg. Left Atrium: Left atrial size was normal in size. Right Atrium: Right atrial size was normal in size. Pericardium: There is no evidence of pericardial effusion. Mitral Valve: The mitral valve is normal in structure. No evidence of mitral valve regurgitation. No evidence of mitral valve stenosis. Tricuspid Valve: The tricuspid valve is normal in structure. Tricuspid valve regurgitation is mild . No evidence of tricuspid stenosis. Aortic Valve: The aortic valve is calcified. There is mild calcification of the aortic valve. There is mild thickening of the aortic valve. Aortic valve regurgitation is not visualized. Aortic valve sclerosis is present, with no evidence of aortic valve stenosis. Aortic valve peak gradient measures 18.8 mmHg. Pulmonic Valve: The pulmonic valve was normal in structure. Pulmonic valve regurgitation is not visualized. No evidence of pulmonic stenosis. Aorta: The aortic root is normal in size and structure. Venous: The inferior vena cava is normal in size with greater than 50% respiratory variability, suggesting right atrial pressure of 3 mmHg. IAS/Shunts: No atrial level shunt detected by color flow Doppler. Additional Comments: A venous catheter is visualized in the right ventricle.  LEFT VENTRICLE PLAX 2D LVIDd:         5.30 cm   Diastology LVIDs:         3.60 cm   LV e' medial:    6.74 cm/s LV PW:         1.30 cm   LV E/e' medial:  18.1 LV IVS:        1.30 cm   LV e' lateral:   9.36 cm/s LVOT diam:     2.10 cm   LV E/e' lateral: 13.0 LV SV:         95 LV SV Index:   46 LVOT Area:     3.46 cm   IVC IVC diam: 2.60 cm LEFT ATRIUM           Index LA diam:      3.80 cm 1.85 cm/m LA Vol (A2C): 23.6 ml 11.52 ml/m LA Vol (A4C): 55.0 ml 26.84 ml/m  AORTIC VALVE                  PULMONIC VALVE AV Area (Vmax): 2.47 cm      PV Vmax:       1.06 m/s AV Vmax:        217.00 cm/s   PV Peak grad:  4.5 mmHg AV Peak Grad:   18.8 mmHg LVOT Vmax:      155.00 cm/s LVOT Vmean:  107.000 cm/s LVOT VTI:       0.273 m  AORTA Ao Root diam: 3.30 cm Ao Asc diam:  3.10 cm MITRAL VALVE                TRICUSPID VALVE MV Area (PHT): 5.42 cm     TR Peak grad:   30.0 mmHg MV Decel Time: 140 msec     TR Vmax:        274.00 cm/s MV E velocity: 122.00 cm/s MV A velocity: 100.00 cm/s  SHUNTS MV E/A ratio:  1.22         Systemic VTI:  0.27 m                             Systemic Diam: 2.10 cm Candee Furbish MD Electronically signed by Candee Furbish MD Signature Date/Time: 01/13/2022/4:59:40 PM    Final      Patient Profile     56 y.o. male with past medical history of hypertension, hyperlipidemia admitted following cardiac arrest.  Cardiac catheterization revealed 30% left main, 90% distal LAD, occluded circumflex (CTO), 60% proximal RCA, 80% distal RCA and ejection fraction 35 to 45%.  Arrest was felt secondary to demand ischemia (walking on treadmill in presence of chronic severe coronary artery disease).  Echocardiogram shows ejection fraction 55 to 60%, question apical variant hypertrophic cardiomyopathy, mild left ventricular enlargement, mild left ventricular hypertrophy, grade 2 diastolic dysfunction, D-shaped septum, moderate pulmonary hypertension.  Assessment & Plan    1 status post cardiac arrest-plan to continue amiodarone for now.  Could likely be discontinued following revascularization.  2 non-ST elevation myocardial infarction/coronary artery disease-he is neurologically intact.  Dr. Ali Lowe previously reviewed cardiac catheterization films and felt that coronary artery bypass and graft would be best option.  We will  ask CVTS to review.  Continue aspirin, metoprolol, heparin and statin.    3 cardiogenic shock-resolved.  Follow-up echocardiogram shows normalization of LV function.  I have personally reviewed the patient's echocardiogram and think apical variant hypertrophic cardiomyopathy is unlikely.  4 acute kidney injury-renal function improved.  5 hypertension-blood pressure is increasing.  We will add losartan 50 mg daily and follow.  6 hyperlipidemia-continue Lipitor.  Transfer to telemetry.  For questions or updates, please contact [place] Please consult [email or web address] for contact info under        Signed, Kirk Ruths, MD  01/15/2022, 7:20 AM

## 2022-01-15 NOTE — Assessment & Plan Note (Addendum)
-   Initial concern for STEMI however cardiology feels that this is NSTEMI -Cardiology has the patient on aspirin, statin, beta-blocker as well as heparin and they are adding losartan -Cardiology feels that coronary bypass and graft will be the best option and able to ask the cardiothoracic surgeon to review and evaluate; TCTS agrees and original plan was to transfer the patient to [facility] for CABG given that he is in the process of moving to [facility] however he will stay at our hospital and get a CABG done and then once he is okay for cardiac rehab then will be discharged and have outpatient cardiac rehab at [facility]  -Further care per plans Cardiology and TCTS -C/w ASA 81 mg po Daily, Atorvastatin 80 mg po Daily, Losartan 50 mg po Daily, Heparin gtt, and Metoprolol 12.5 mg po BID

## 2022-01-15 NOTE — Assessment & Plan Note (Addendum)
-  Patient's Platelet Count went from 131 -> 141 -> 191 -> 181 -Continue to Monitor for S/Sx of Bleeding; No overt bleeding noted -Repeat CBC in the AM

## 2022-01-15 NOTE — Assessment & Plan Note (Addendum)
-   Patient's blood pressure has improved and is now 143/92 -Continue with Losartan 50 mg p.o. daily as well as Metoprolol 12.5 mg p.o. twice daily -Continue monitor blood pressures per protocol

## 2022-01-15 NOTE — Progress Notes (Signed)
ANTICOAGULATION CONSULT NOTE - Follow-Up  Pharmacy Consult:  Heparin Indication: chest pain/ACS  No Known Allergies  Patient Measurements: Height: 5\' 9"  (175.3 cm) Weight: 93.3 kg (205 lb 11 oz) IBW/kg (Calculated) : 70.7 Heparin Dosing Weight: 89 kg  Vital Signs: Temp: 98 F (36.7 C) (02/26 0356) Temp Source: Oral (02/26 0356) BP: 131/79 (02/26 0300) Pulse Rate: 58 (02/26 0300)  Labs: Recent Labs    01/12/22 2044 01/12/22 2147 01/13/22 0005 01/13/22 0249 01/13/22 1033 01/14/22 0450 01/14/22 1154 01/15/22 0305  HGB  --   --   --  14.3  --  12.2*  --  12.1*  HCT  --   --   --  40.8  --  34.9*  --  35.7*  PLT  --   --   --  197  --  131*  --  141*  HEPARINUNFRC  --   --   --   --    < > 0.30 0.27* 0.21*  CREATININE  --    < >  --  1.36*  --  1.15  --  1.06  TROPONINIHS 6,634*  --  9,382* 9,472*  --   --   --   --    < > = values in this interval not displayed.     Estimated Creatinine Clearance: 89.8 mL/min (by C-G formula based on SCr of 1.06 mg/dL).   Medical History: Past Medical History:  Diagnosis Date   Hyperlipidemia     Assessment: 49 YOM presented s/p cardiac arrest and underwent emergent cath where a Swanz was placed.  Pharmacy consulted to dose IV heparin.  Cath revealed multivessel CAD, awaiting revascularization plan.  Heparin level 0.21, subtherapeutic despite rate increase. Currently, no known issues with IV infusion, no overt bleeding or complications noted.  Awaiting CABG evaluation  Goal of Therapy:  Heparin level 0.3-0.7 units/ml Monitor platelets by anticoagulation protocol: Yes   Plan:  Increase IV heparin to 1800 units/hr. Recheck heparin level in 6 hours.   Daily heparin level and CBC.  [employer], Pharm.D., BCPS Clinical Pharmacist  **Pharmacist phone directory can be found on [email or web address] listed under [place].  01/15/2022 4:59 AM

## 2022-01-16 DIAGNOSIS — I10 Essential (primary) hypertension: Secondary | ICD-10-CM

## 2022-01-16 DIAGNOSIS — N179 Acute kidney failure, unspecified: Secondary | ICD-10-CM

## 2022-01-16 DIAGNOSIS — E785 Hyperlipidemia, unspecified: Secondary | ICD-10-CM

## 2022-01-16 DIAGNOSIS — I214 Non-ST elevation (NSTEMI) myocardial infarction: Principal | ICD-10-CM

## 2022-01-16 DIAGNOSIS — R570 Cardiogenic shock: Secondary | ICD-10-CM

## 2022-01-16 LAB — BASIC METABOLIC PANEL
Anion gap: 10 (ref 5–15)
BUN: 13 mg/dL (ref 6–20)
CO2: 24 mmol/L (ref 22–32)
Calcium: 8.5 mg/dL — ABNORMAL LOW (ref 8.9–10.3)
Chloride: 105 mmol/L (ref 98–111)
Creatinine, Ser: 1.19 mg/dL (ref 0.61–1.24)
GFR, Estimated: >60 mL/min (ref >60)
Glucose, Bld: 101 mg/dL — ABNORMAL HIGH (ref 70–99)
Potassium: 3.9 mmol/L (ref 3.5–5.1)
Sodium: 139 mmol/L (ref 135–145)

## 2022-01-16 LAB — CULTURE, BLOOD (ROUTINE X 2): Special Requests: ADEQUATE

## 2022-01-16 LAB — CBC
HCT: 38.7 % — ABNORMAL LOW (ref 39.0–52.0)
Hemoglobin: 13.7 g/dL (ref 13.0–17.0)
MCH: 30.2 pg (ref 26.0–34.0)
MCHC: 35.4 g/dL (ref 30.0–36.0)
MCV: 85.4 fL (ref 80.0–100.0)
Platelets: 191 10*3/uL (ref 150–400)
RBC: 4.53 MIL/uL (ref 4.22–5.81)
RDW: 12.3 % (ref 11.5–15.5)
WBC: 10.9 10*3/uL — ABNORMAL HIGH (ref 4.0–10.5)
nRBC: 0.2 % (ref 0.0–0.2)

## 2022-01-16 LAB — HEPARIN LEVEL (UNFRACTIONATED): Heparin Unfractionated: 0.25 IU/mL — ABNORMAL LOW (ref 0.30–0.70)

## 2022-01-16 LAB — SURGICAL PCR SCREEN
MRSA, PCR: NEGATIVE
Staphylococcus aureus: POSITIVE — AB

## 2022-01-16 MED ORDER — MUPIROCIN 2 % EX OINT
1.0000 "application " | TOPICAL_OINTMENT | Freq: Two times a day (BID) | CUTANEOUS | Status: DC
  Administered 2022-01-16 – 2022-01-17 (×3): 1 via NASAL
  Filled 2022-01-16: qty 22

## 2022-01-16 MED ORDER — CHLORHEXIDINE GLUCONATE CLOTH 2 % EX PADS
6.0000 | MEDICATED_PAD | Freq: Every day | CUTANEOUS | Status: DC
  Administered 2022-01-17: 6 via TOPICAL

## 2022-01-16 NOTE — Progress Notes (Signed)
°   01/16/22 1049  Clinical Encounter Type  Visited With Patient and family together;Health care provider  Visit Type Initial  Referral From Nurse (Velia G. Gunnar Bulla, RN)  Consult/Referral To Chaplain   Page requesting Manufacturing engineer for Power of Attorney for Mr. Penelope Galas. Upon arrival to room learned that it was for a durable Power of Attorney. Explained to patient's family that hospital Not[facility] only provides Health Care P.O.A. Patient's wife will make other arrangements. [address] [place], [place] [phone].

## 2022-01-16 NOTE — Plan of Care (Signed)
°  Problem: Education: Goal: Understanding of cardiac disease, CV risk reduction, and recovery process will improve Outcome: Progressing   Problem: Education: Goal: Understanding of medication regimen will improve Outcome: Progressing   Problem: Cardiac: Goal: Ability to achieve and maintain adequate cardiopulmonary perfusion will improve Outcome: Progressing   Problem: Activity: Goal: Ability to tolerate increased activity will improve Outcome: Progressing

## 2022-01-16 NOTE — Progress Notes (Addendum)
Karie Mainland from [facility] called regarding transfer. Waiting for a bed assignment.  Plan for CABG at [facility].

## 2022-01-16 NOTE — Progress Notes (Signed)
[facility] called with a bed ready for pt.  Pt and wife stated that they would rather stay here. Will contact MD to make aware.

## 2022-01-16 NOTE — Progress Notes (Signed)
ANTICOAGULATION CONSULT NOTE  Pharmacy Consult:  Heparin Indication: chest pain/ACS  No Known Allergies  Patient Measurements: Height: 5\' 9"  (175.3 cm) Weight: 89.9 kg (198 lb 3.2 oz) IBW/kg (Calculated) : 70.7 Heparin Dosing Weight: 89 kg  Vital Signs: Temp: 97.9 F (36.6 C) (02/27 0254) Temp Source: Oral (02/27 0254) BP: 147/75 (02/27 0917) Pulse Rate: 63 (02/27 0917)  Labs: Recent Labs    01/14/22 0450 01/14/22 1154 01/15/22 0305 01/15/22 1227 01/16/22 0742  HGB 12.2*  --  12.1*  --  13.7  HCT 34.9*  --  35.7*  --  38.7*  PLT 131*  --  141*  --  191  HEPARINUNFRC 0.30   < > 0.21* 0.32 0.25*  CREATININE 1.15  --  1.06  --  1.19   < > = values in this interval not displayed.     Estimated Creatinine Clearance: 78.7 mL/min (by C-G formula based on SCr of 1.19 mg/dL).   Medical History: Past Medical History:  Diagnosis Date   Hyperlipidemia     Assessment: 47 YOM presented s/p cardiac arrest and underwent emergent cath and noted with multivessel CAD  Pharmacy consulted to dose IV heparin.  Cath revealed multivessel CAD, for possible CABG.  Heparin level 0.25, CBC stable  Goal of Therapy:  Heparin level 0.3-0.7 units/ml Monitor platelets by anticoagulation protocol: Yes   Plan:  Increase heparin to 1950 units/hr Recheck heparin level with AM labs. Daily heparin level and CBC.  57, PharmD Clinical Pharmacist **Pharmacist phone directory can now be found on [email or web address] ([email or web address]).  Listed under [facility].

## 2022-01-16 NOTE — Progress Notes (Addendum)
Progress Note  Patient Name: Jeremy Winters Date of Encounter: 01/16/2022  [facility] Cardiologist: Peter Martinique, MD   Subjective   Patient denied chest pain, SOB, palpitations. Had previously discussed being transferred to [place] for CABG, but patient and his wife would rather have the surgery done here. Discussed that the transfer had been accepted by [facility], patient would still like to proceed here.   Inpatient Medications    Scheduled Meds:  amiodarone  200 mg Oral BID   aspirin  81 mg Oral Daily   atorvastatin  80 mg Oral Daily   chlorhexidine gluconate (MEDLINE KIT)  15 mL Mouth Rinse BID   Chlorhexidine Gluconate Cloth  6 each Topical Daily   docusate sodium  100 mg Oral BID   losartan  50 mg Oral Daily   mouth rinse  15 mL Mouth Rinse BID   metoprolol tartrate  12.5 mg Oral BID   pantoprazole  40 mg Oral QHS   polyethylene glycol  17 g Oral Daily   sodium chloride flush  3 mL Intravenous Q12H   Continuous Infusions:  sodium chloride     heparin 1,800 Units/hr (01/16/22 0921)   PRN Meds: sodium chloride, acetaminophen, ondansetron (ZOFRAN) IV, prochlorperazine, sodium chloride flush   Vital Signs    Vitals:   01/15/22 1539 01/15/22 2004 01/16/22 0254 01/16/22 0917  BP: (!) 154/90 129/88 133/85 (!) 147/75  Pulse: 65 62 (!) 55 63  Resp: _0 Temp: 98.2 F (36.8 C) 98 F (36.7 C) 97.9 F (36.6 C)   TempSrc: Oral Oral Oral   SpO2: 97% 91% 93% 95%  Weight: 90.9 kg  89.9 kg   Height: _1  (1.753 m)       Intake/Output Summary (Last 24 hours) at 01/16/2022 1027 Last data filed at 01/16/2022 9528 Gross per 24 hour  Intake 1113.29 ml  Output 2175 ml  Net -1061.71 ml   Last 3 Weights 01/16/2022 01/15/2022 01/15/2022  Weight (lbs) 198 lb 3.2 oz 200 lb 6.4 oz 202 lb 14.4 oz  Weight (kg) 89.903 kg 90.9 kg 92.035 kg      Telemetry    Sinus rhythm, HR in the 60s-70s - Personally Reviewed  ECG    No new tracings since 2/23 - Personally  Reviewed  Physical Exam   GEN: No acute distress.   Neck: No JVD Cardiac: RRR, no murmurs, rubs, or gallops. Radial pulses 2+ bilaterally, right radial cath site stable. Respiratory: Clear to auscultation bilaterally. No increased WOB  GI: Soft, nontender, non-distended  MS: No edema; No deformity. Neuro:  Nonfocal  Psych: Normal affect   Labs    High Sensitivity Troponin:   Recent Labs  Lab 01/12/22 1653 01/12/22 2044 01/13/22 0005 01/13/22 0249  TROPONINIHS 49* 6,634* 9,382* 9,472*     Chemistry Recent Labs  Lab 01/12/22 1653 01/12/22 1729 01/12/22 2044 01/12/22 2147 01/13/22 0249 01/14/22 0450 01/15/22 0305 01/16/22 0742  NA 138   < >  --    < > 136 137 139 139  K 3.8   < > 6.6*   < > 4.1 3.6 4.1 3.9  CL 101   < >  --    < > 103 102 105 105  CO2 19*  --   --    < > _2 GLUCOSE 238*   < >  --    < > 166* 117* 112* 101*  BUN 21*   < >  --    < >  22* _0 CREATININE 1.64*   < >  --    < > 1.36* 1.15 1.06 1.19  CALCIUM 8.4*  --   --    < > 7.9* 7.9* 8.2* 8.5*  MG 2.3  --  2.2  --  2.0  --   --   --   PROT 6.9  --   --   --  6.1*  --   --   --   ALBUMIN 3.8  --   --   --  3.4*  --   --   --   AST 143*  --   --   --  130*  --   --   --   ALT 152*  --   --   --  142*  --   --   --   ALKPHOS 56  --   --   --  48  --   --   --   BILITOT 0.7  --   --   --  0.8  --   --   --   GFRNONAA 49*  --   --    < > >60 >60 >60 >60  ANIONGAP 18*  --   --    < > _1 < > = values in this interval not displayed.    Lipids  Recent Labs  Lab 01/13/22 0000 01/13/22 0249  CHOL 192  --   TRIG 270* 286*  HDL 38*  --   LDLCALC 100*  --   CHOLHDL 5.1  --     Hematology Recent Labs  Lab 01/14/22 0450 01/15/22 0305 01/16/22 0742  WBC 12.6* 10.7* 10.9*  RBC 3.93* 4.04* 4.53  HGB 12.2* 12.1* 13.7  HCT 34.9* 35.7* 38.7*  MCV 88.8 88.4 85.4  MCH 31.0 30.0 30.2  MCHC 35.0 33.9 35.4  RDW 13.2 12.7 12.3  PLT 131* 141* 191   Thyroid No results for  input(s): TSH, FREET4 in the last 168 hours.  BNPNo results for input(s): BNP, PROBNP in the last 168 hours.  DDimer No results for input(s): DDIMER in the last 168 hours.   Radiology    No results found.  Cardiac Studies   Right/Left Heart Cath on 01/12/2022    Mid LM to Dist LM lesion is 30% stenosed.   Dist LAD lesion is 90% stenosed.   Prox Cx to Mid Cx lesion is 100% stenosed.   Prox RCA to Mid RCA lesion is 60% stenosed.   Dist RCA lesion is 80% stenosed.   The left ventricular ejection fraction is 35-45% by visual estimate.  3 vessel obstructive CAD. There is 90% stenosis in the distal LAD. The LCX is a moderately large vessel with 100% mid vessel CTO with well established collaterals. The RCA has an 80% distal lesion. LV function is mild to moderately reduced but may not represent true function since patient was significantly tachycardic at the time.  Low/normal LV filling pressures with PCWP mean of 7.  Normal right heart pressures. Low cardiac output with Index 1.65.    Plan: his cardiac arrest appears to be related to demand ischemia ( walking on treadmill in presence of chronic severe CAD). Recommend medical therapy for now. With recovery of neurologic function we can decide ultimate revascularization strategy. Will continue IV amiodarone. Resume Heparin in 8 hours post sheath removal. ASA and statin therapy. CCM to manage vent and critical care issues.  Diagnostic Dominance: Right  Patient Profile     56 y.o. male with past medical history of hypertension, hyperlipidemia admitted following cardiac arrest.  Cardiac catheterization revealed 30% left main, 90% distal LAD, occluded circumflex (CTO), 60% proximal RCA, 80% distal RCA and ejection fraction 35 to 45%.  Arrest was felt secondary to demand ischemia (walking on treadmill in presence of chronic severe coronary artery disease).  Echocardiogram shows ejection fraction 55 to 60%, question apical variant hypertrophic  cardiomyopathy, mild left ventricular enlargement, mild left ventricular hypertrophy, grade 2 diastolic dysfunction, D-shaped septum, moderate pulmonary hypertension.  Assessment & Plan    S/P cardiac arrest - In the ED prior to cath, patient was having brief runs of NSVT, frequent PVCs  - Per telemetry, patient has been maintaining sinus rhythm  - Continue amiodarone for now  - Will likely be discontinued following revascularization   NSTEMI  - R/L heart cath on 2/23 showed 3 vessel obstructive CAD.  - Dr. Ali Lowe previously reviewed cardiac catheterization films and felt that coronary artery bypass and graft would be best option - CVTS consulted, per their note they agree that  multivessel surgical coronary revascularization is the best plan, patient originally wanted to be transferred to [facility] for surgery, would now prefer to stay here. Informed CVTS team  - Continue daily aspirin, lipitor 80 mg, metoprolol tartrated 12.5 mg BID, IV heparin   Cardiogenic Shock- Resolved  - Follow-up echocardiogram showed normalization of LV function (EF 55-60%), no WMA - BP increasing, patient is tolerating low dose BB and losartan well  HTN  - BP has been increasing  - Started losartan 50 mg daily yesterday 2/26  AKI- Resolved  - Creatinine, BUN, eGFR have normalized  HLD  - LDL 100, HDL 38, triglycerides 270 this admission  - Continue lipitor 80 mg daily    For questions or updates, please contact [facility] Please consult [email or web address] for contact info under        Signed, Margie Billet, PA-C  01/16/2022, 10:27 AM     Patient seen and examined. Agree with assessment and plan.  Patient feels well without recurrent chest pain or shortness of breath.  I had discussed with Dr. Lynder Parents at [facility] earlier this morning regarding transfer to [facility] for cardiac surgery.  He had accepted the patient for transfer; however, the patient now has decided to stay in  [place] at [facility] to undergo his CABG revascularization.  We will cancel for his transfer to [facility].  We will need to notify cardiac surgeons for of cardiac surgery here.  Presently, the patient is not having any recurrent chest pain.  His echo Doppler study shows normalization of LV function EF 55 to 60% without regional wall motion abnormalities.  There is grade 2 diastolic dysfunction and probable right ventricular volume overload with flattened interventricular septum in diastole.  Resting heart rate is in the 60s.  Blood pressure 140/75 presently.  No JVD.  Lungs are clear without wheezing.  Rhythm is regular with 1/6 systolic murmur.  Right radial cath site stable.  No edema.  He continues to be on amiodarone 200 mg twice a day, and is now on losartan 50 mg daily in addition to metoprolol tartrate 12.5 mg twice a day.  CBC and renal function stable.  At present patient is currently on amiodarone and maximal dose atorvastatin.  Post CABG consider switching to rosuvastatin 40 mg and adding Vascepa with elevated triglycerides.   Troy Sine,  MD, [facility] 01/16/2022 11:47 AM

## 2022-01-16 NOTE — Plan of Care (Signed)
°  Problem: Cardiac: Goal: Ability to achieve and maintain adequate cardiopulmonary perfusion will improve Outcome: Progressing   Problem: Health Behavior/Discharge Planning: Goal: Ability to safely manage health-related needs after discharge will improve Outcome: Progressing   Problem: Respiratory: Goal: Ability to maintain a clear airway and adequate ventilation will improve Outcome: Progressing   Problem: Activity: Goal: Ability to tolerate increased activity will improve Outcome: Progressing

## 2022-01-16 NOTE — Progress Notes (Signed)
Progress Note   Patient: Jeremy Winters [id number] DOB: 05-Jan-1966 DOA: 01/12/2022     4 DOS: the patient was seen and examined on 01/16/2022   Brief hospital course: Patient is a 56 year old overweight Caucasian male with past medical history significant for but not limited to hypertension and hyperlipidemia as well as other comorbidities who was exercising at [employer] and then had a witnessed collapse.  Bystanders called EMS but did not perform CPR on the patient until 5 minutes prior to EMS arrival he was found to be in pulseless V. tach and received ACLS measures including 2 epis and defibrillations for V. tach versus V-fib prior to ROSC after 18 minutes with a King airway being placed.  Per the triage note EMS had reported some purposeful movement in route and he was given Versed for agitation.  Upon arrival to the ED EKG showed ST elevations in V1 and V2 with reciprocal changes in the lateral and inferior leads.  Patient was given amnio bolus and started on amiodarone drip.  He was then taken to the Cath Lab emergently and PCCM was consulted for mission after his Cath Lab.  He remained intubated and was extubated on 01/13/2022.  He was admitted for his V-fib/V. tach arrest and concern for STEMI but was actually deemed a NSTEMI and cardiology evaluated and left heart cath demonstrated complete occlusion of the left circumflex and good collaterals and cardiac arrest may have been stressed induced.  Cardiology is recommending continuing beta-blocker and heparin drip and IV amiodarone transitioned to p.o.  Patient is to be evaluated by TCTS for consideration of CABG.  Subsequently patient was transferred to [facility] service on 01/15/2022.  Cardiology recommends continuing aspirin, beta-blocker, heparin and statin for now  Cardiothoracic surgery evaluated and Dr. Rod Mae recommends that the multivessel surgical coronary revascularization would be the best long-term therapy for the patient.   Initially patient was to be transferred to [facility] given that he is in the process of moving to [facility] but patient has decided to stay here until his CABG is done and plan is to either go for CABG in the morning or on Wednesday.  In the interim we will continue heparin drip per cardiology recommendations and they recommend continuing amiodarone for now and likely discontinuing after revascularization.  Assessment and Plan: Overweight with body mass index (BMI) [id number] -Complicates overall prognosis and care -Estimated body mass index is 29.96 kg/m as calculated from the following:   Height as of this encounter: 5\' 9"  (1.753 m).   Weight as of this encounter: 92 kg.  -Weight Loss and Dietary Counseling given   Thrombocytopenia (HCC) -Patient's Platelet Count went from 131 -> 141 -> 191 -Continue to Monitor for S/Sx of Bleeding; No overt bleeding noted -Repeat CBC in the AM   Normocytic anemia -Patient's Hgb/Hct is now relatively Stable  -Hgb/Hct is now 12.1/35.7 -> 13.7/38.7 -Check anemia panel in the a.m. -Continue to monitor for signs and symptoms of bleeding; currently no overt bleeding noted -Repeat CBC in a.m.  Leukocytosis -Improving and likely reactive in the setting of Arrest -Patient's WBC went from 12.6 -> 10.7 -> 10.9 -Continue to Monitor and Trend -Repeat CBC in the AM  Hyperglycemia -Hemoglobin A1c was 5.2 -Likely reactive in CBGs ranging from 104-111 -continue monitor blood sugars per protocol continue sliding scale insulin  Acute respiratory failure with hypoxia (HCC) -Improving -No longer intubated and was extubated on 01/13/2022 -Remains on 2 L of supplemental oxygen nasal SpO2: 93 % O2 Flow  Rate (L/min): 2 L/min FiO2 (%): 40 % -Continuous pulse oximetry maintain O2 saturation greater 90% -Continue supplemental oxygen via nasal cannula and wean to room air -We will need an amatory home O2 screen and repeat chest x-ray prior to  discharge  Cardiogenic shock (HCC) -Resolved now -Repeat echocardiogram shows normalization of LV function -Cardiology feels that this is less likely an apical variant hypertrophic cardiomyopathy -As above and further care per Cardiology  Essential hypertension - Patient's blood pressure has improved and is now 159/90 -Continue with Losartan 50 mg p.o. daily as well as Metoprolol 12.5 mg p.o. twice daily -Continue monitor blood pressures per protocol  Hyperlipidemia -Continue with his statin -Lipid panel done and showed total cholesterol/HDL ratio 5.1, cholesterol level 92, HDL 38, triglycerides of 286, VLDL 54  NSTEMI (non-ST elevated myocardial infarction) ([place]) - Initial concern for STEMI however cardiology feels that this is NSTEMI -Cardiology has the patient on aspirin, statin, beta-blocker as well as heparin and they are adding losartan -Cardiology feels that coronary bypass and graft will be the best option and able to ask the cardiothoracic surgeon to review and evaluate; TCTS agrees and original plan was to transfer the patient to [facility] for CABG given that he is in the process of moving to [facility] however he will stay at our hospital and get a CABG done and then once he is okay for cardiac rehab then will be discharged and have outpatient cardiac rehab at [facility]   AKI (acute kidney injury) [facility]) -Renal function is improving -Patient presented and had an AKI on admission is now resolved as his last BUN/creatinine is 10/1.06 -> 13/1.19 -Avoid further nephrotoxic medications, contrast dyes if possible, hypotension and renally dose medications -Repeat CMP in the a.m.   Cardiopulmonary arrest with successful resuscitation [facility]) -Likely V-fib/V. tach arrest -Patient is status post CPR x18 minutes and did not receive CPR for approximately 5 minutes until EMS arrived.  There is reported purposeful movements in route and required sedation for agitation -Patient is status  post left heart cath that demonstrated complete occlusion of the left circumflex with good collaterals and that cardiac arrest may have been stress-induced in setting of exercise -Cardiology is following and patient is being evaluated by the TCTS for CABG prior to discharge; patient is a candidate for CABG and cardiothoracic surgery recommends that this would be the best mode of revascularization.  Patient was to be transferred to [facility] for CABG given that he is in the process of moving to [facility] however he will not stay here and do the CABG here and then be going to [facility] for cardiac rehab -Currently remains on the heparin drip, beta-blocker and his amiodarone has been changed to p.o. -Cardiology recommends continue amiodarone for now and this was transitioned as above but likely recommending that this can be discontinued following revascularization   Subjective: Seen and examined at bedside and he is doing okay.  Understands that he needs a CABG and is planning on having it done here.  No other concerns.  Denied chest pain or shortness of breath.  Physical Exam: Vitals:   01/15/22 2004 01/16/22 0254 01/16/22 0917 01/16/22 1152  BP: 129/88 133/85 (!) 147/75 (!) 159/90  Pulse: 62 (!) 55 63 (!) 57  Resp: 19 18 18 16   Temp: 98 F (36.7 C) 97.9 F (36.6 C)  98.3 F (36.8 C)  TempSrc: Oral Oral  Oral  SpO2: 91% 93% 95% 95%  Weight:  89.9 kg  Height:       Examination: Physical Exam:  Constitutional: WN/WD overweight Caucasian male in NAD and appears calm and comfortable Respiratory: Diminished to auscultation bilaterally, no wheezing, rales, rhonchi or crackles. Wearing Supplemental O2 via [place] Cardiovascular: RRR, no murmurs / rubs / gallops. S1 and S2 auscultated. Trace LE Edema  Abdomen: Soft, non-tender, Distended 2/2 body habitus.  Bowel sounds positive.  GU: Deferred. Musculoskeletal: No clubbing / cyanosis of digits/nails. No joint deformity upper and lower  extremities. Good ROM, no contractures. Normal strength and muscle tone.   Data Reviewed:  I have independently reviewed and assessed the patient's clinical laboratory data including his BMP and CBC  Patient's glucose is now 101 on BMP and his hemoglobin/hematocrit is stable at 13.7/38.7 and WBC is 10.9.  Platelet count has improved to 191  Family Communication: Discussed with wife at bedside  Disposition: Status is: Inpatient Remains inpatient appropriate because: Needs CABG  Planned Discharge Destination:  To be Determined after CABG  DVT Prophylaxis: Anticoagulated with Heparin gtt  Author: Raiford Noble, DO [facility]  01/16/2022 1:41 PM  For on call review [email or web address].

## 2022-01-17 ENCOUNTER — Inpatient Hospital Stay (HOSPITAL_COMMUNITY): Payer: 59

## 2022-01-17 DIAGNOSIS — J9601 Acute respiratory failure with hypoxia: Secondary | ICD-10-CM

## 2022-01-17 DIAGNOSIS — Z01810 Encounter for preprocedural cardiovascular examination: Secondary | ICD-10-CM | POA: Diagnosis not present

## 2022-01-17 DIAGNOSIS — I2510 Atherosclerotic heart disease of native coronary artery without angina pectoris: Secondary | ICD-10-CM

## 2022-01-17 DIAGNOSIS — R7989 Other specified abnormal findings of blood chemistry: Secondary | ICD-10-CM

## 2022-01-17 LAB — CBC
HCT: 37.9 % — ABNORMAL LOW (ref 39.0–52.0)
Hemoglobin: 13.8 g/dL (ref 13.0–17.0)
MCH: 30.7 pg (ref 26.0–34.0)
MCHC: 36.4 g/dL — ABNORMAL HIGH (ref 30.0–36.0)
MCV: 84.2 fL (ref 80.0–100.0)
Platelets: 181 10*3/uL (ref 150–400)
RBC: 4.5 MIL/uL (ref 4.22–5.81)
RDW: 12.5 % (ref 11.5–15.5)
WBC: 9.7 10*3/uL (ref 4.0–10.5)
nRBC: 0.6 % — ABNORMAL HIGH (ref 0.0–0.2)

## 2022-01-17 LAB — COMPREHENSIVE METABOLIC PANEL
ALT: 87 U/L — ABNORMAL HIGH (ref 0–44)
AST: 67 U/L — ABNORMAL HIGH (ref 15–41)
Albumin: 3.2 g/dL — ABNORMAL LOW (ref 3.5–5.0)
Alkaline Phosphatase: 49 U/L (ref 38–126)
Anion gap: 10 (ref 5–15)
BUN: 16 mg/dL (ref 6–20)
CO2: 25 mmol/L (ref 22–32)
Calcium: 8.5 mg/dL — ABNORMAL LOW (ref 8.9–10.3)
Chloride: 104 mmol/L (ref 98–111)
Creatinine, Ser: 1.21 mg/dL (ref 0.61–1.24)
GFR, Estimated: >60 mL/min (ref >60)
Glucose, Bld: 101 mg/dL — ABNORMAL HIGH (ref 70–99)
Potassium: 3.7 mmol/L (ref 3.5–5.1)
Sodium: 139 mmol/L (ref 135–145)
Total Bilirubin: 1 mg/dL (ref 0.3–1.2)
Total Protein: 6.4 g/dL — ABNORMAL LOW (ref 6.5–8.1)

## 2022-01-17 LAB — MAGNESIUM: Magnesium: 2.1 mg/dL (ref 1.7–2.4)

## 2022-01-17 LAB — HEPARIN LEVEL (UNFRACTIONATED): Heparin Unfractionated: 0.42 IU/mL (ref 0.30–0.70)

## 2022-01-17 LAB — PREPARE RBC (CROSSMATCH)

## 2022-01-17 LAB — PHOSPHORUS: Phosphorus: 3.8 mg/dL (ref 2.5–4.6)

## 2022-01-17 LAB — TSH: TSH: 6.308 u[IU]/mL — ABNORMAL HIGH (ref 0.350–4.500)

## 2022-01-17 MED ORDER — POTASSIUM CHLORIDE 2 MEQ/ML IV SOLN
80.0000 meq | INTRAVENOUS | Status: DC
Start: 2022-01-18 — End: 2022-01-18
  Filled 2022-01-17: qty 40

## 2022-01-17 MED ORDER — VANCOMYCIN HCL 1250 MG/250ML IV SOLN
1250.0000 mg | INTRAVENOUS | Status: AC
  Administered 2022-01-18: 1250 mg via INTRAVENOUS
  Filled 2022-01-17: qty 250

## 2022-01-17 MED ORDER — TEMAZEPAM 15 MG PO CAPS
15.0000 mg | ORAL_CAPSULE | Freq: Once | ORAL | Status: AC | PRN
  Administered 2022-01-17: 15 mg via ORAL
  Filled 2022-01-17: qty 1

## 2022-01-17 MED ORDER — PHENYLEPHRINE HCL-NACL 20-0.9 MG/250ML-% IV SOLN
30.0000 ug/min | INTRAVENOUS | Status: AC
  Administered 2022-01-18: 15 ug/min via INTRAVENOUS
  Filled 2022-01-17: qty 250

## 2022-01-17 MED ORDER — TRANEXAMIC ACID 1000 MG/10ML IV SOLN
1.5000 mg/kg/h | INTRAVENOUS | Status: AC
  Administered 2022-01-18: 1.5 mg/kg/h via INTRAVENOUS
  Filled 2022-01-17: qty 25

## 2022-01-17 MED ORDER — CHLORHEXIDINE GLUCONATE 4 % EX LIQD
60.0000 mL | Freq: Once | CUTANEOUS | Status: AC
  Administered 2022-01-18: 4 via TOPICAL
  Filled 2022-01-17 (×2): qty 60

## 2022-01-17 MED ORDER — INSULIN REGULAR(HUMAN) IN NACL 100-0.9 UT/100ML-% IV SOLN
INTRAVENOUS | Status: AC
Start: 2022-01-18 — End: 2022-01-19
  Administered 2022-01-18: .7 [IU]/h via INTRAVENOUS
  Filled 2022-01-17: qty 100

## 2022-01-17 MED ORDER — BISACODYL 5 MG PO TBEC: 5.0000 mg | DELAYED_RELEASE_TABLET | Freq: Once | ORAL | Status: DC

## 2022-01-17 MED ORDER — EPINEPHRINE HCL 5 MG/250ML IV SOLN IN NS
0.0000 ug/min | INTRAVENOUS | Status: DC
  Filled 2022-01-17: qty 250

## 2022-01-17 MED ORDER — METOPROLOL TARTRATE 12.5 MG HALF TABLET
12.5000 mg | ORAL_TABLET | Freq: Once | ORAL | Status: AC
  Administered 2022-01-18: 12.5 mg via ORAL
  Filled 2022-01-17: qty 1

## 2022-01-17 MED ORDER — DEXMEDETOMIDINE HCL IN NACL 400 MCG/100ML IV SOLN
0.1000 ug/kg/h | INTRAVENOUS | Status: AC
  Administered 2022-01-18: .3 ug/kg/h via INTRAVENOUS
  Filled 2022-01-17: qty 100

## 2022-01-17 MED ORDER — NOREPINEPHRINE 4 MG/250ML-% IV SOLN
0.0000 ug/min | INTRAVENOUS | Status: DC
  Filled 2022-01-17: qty 250

## 2022-01-17 MED ORDER — MAGNESIUM SULFATE 50 % IJ SOLN
40.0000 meq | INTRAMUSCULAR | Status: DC
  Filled 2022-01-17: qty 9.85

## 2022-01-17 MED ORDER — HEPARIN 30,000 UNITS/1000 ML (OHS) CELLSAVER SOLUTION
Status: DC
  Filled 2022-01-17: qty 1000

## 2022-01-17 MED ORDER — PAPAVERINE HCL 30 MG/ML IJ SOLN
INTRAMUSCULAR | Status: DC
  Filled 2022-01-17: qty 2.5

## 2022-01-17 MED ORDER — CHLORHEXIDINE GLUCONATE 0.12 % MT SOLN
15.0000 mL | Freq: Once | OROMUCOSAL | Status: AC
  Administered 2022-01-18: 15 mL via OROMUCOSAL
  Filled 2022-01-17: qty 15

## 2022-01-17 MED ORDER — MILRINONE LACTATE IN DEXTROSE 20-5 MG/100ML-% IV SOLN
0.3000 ug/kg/min | INTRAVENOUS | Status: AC
  Administered 2022-01-18: .25 ug/kg/min via INTRAVENOUS
  Filled 2022-01-17: qty 100

## 2022-01-17 MED ORDER — CEFAZOLIN SODIUM-DEXTROSE 2-4 GM/100ML-% IV SOLN
2.0000 g | INTRAVENOUS | Status: AC
  Administered 2022-01-18 (×2): 2 g via INTRAVENOUS
  Filled 2022-01-17: qty 100

## 2022-01-17 MED ORDER — NITROGLYCERIN IN D5W 200-5 MCG/ML-% IV SOLN
2.0000 ug/min | INTRAVENOUS | Status: DC
  Filled 2022-01-17: qty 250

## 2022-01-17 MED ORDER — TRANEXAMIC ACID (OHS) PUMP PRIME SOLUTION
2.0000 mg/kg | INTRAVENOUS | Status: DC
  Filled 2022-01-17: qty 1.78

## 2022-01-17 MED ORDER — DIAZEPAM 5 MG PO TABS
5.0000 mg | ORAL_TABLET | Freq: Once | ORAL | Status: AC
  Administered 2022-01-18: 5 mg via ORAL
  Filled 2022-01-17: qty 1

## 2022-01-17 MED ORDER — TRANEXAMIC ACID (OHS) BOLUS VIA INFUSION
15.0000 mg/kg | INTRAVENOUS | Status: AC
  Administered 2022-01-18: 1338 mg via INTRAVENOUS
  Filled 2022-01-17: qty 1338

## 2022-01-17 MED ORDER — CHLORHEXIDINE GLUCONATE 4 % EX LIQD
60.0000 mL | Freq: Once | CUTANEOUS | Status: AC
  Administered 2022-01-17: 4 via TOPICAL
  Filled 2022-01-17 (×2): qty 60

## 2022-01-17 MED ORDER — CEFAZOLIN SODIUM-DEXTROSE 2-4 GM/100ML-% IV SOLN
2.0000 g | INTRAVENOUS | Status: DC
Start: 2022-01-18 — End: 2022-01-18
  Filled 2022-01-17: qty 100

## 2022-01-17 NOTE — Progress Notes (Signed)
ANTICOAGULATION CONSULT NOTE  Pharmacy Consult:  Heparin Indication: chest pain/ACS  No Known Allergies  Patient Measurements: Height: 5\' 9"  (175.3 cm) Weight: 89.2 kg (196 lb 11.2 oz) (scale c) IBW/kg (Calculated) : 70.7 Heparin Dosing Weight: 89 kg  Vital Signs: Temp: 98.3 F (36.8 C) (02/28 0337) Temp Source: Oral (02/28 0337) BP: 148/94 (02/28 0337) Pulse Rate: 54 (02/28 0337)  Labs: Recent Labs    01/15/22 0305 01/15/22 1227 01/16/22 0742 01/17/22 0314  HGB 12.1*  --  13.7 13.8  HCT 35.7*  --  38.7* 37.9*  PLT 141*  --  191 181  HEPARINUNFRC 0.21* 0.32 0.25* 0.42  CREATININE 1.06  --  1.19 1.21     Estimated Creatinine Clearance: 77.1 mL/min (by C-G formula based on SCr of 1.21 mg/dL).   Medical History: Past Medical History:  Diagnosis Date   Hyperlipidemia     Assessment: 33 YOM presented s/p cardiac arrest and underwent emergent cath and noted with multivessel CAD  Pharmacy consulted to dose IV heparin.  Cath revealed multivessel CAD, for CABG on 3/1.  Heparin level 0.42, CBC stable  Goal of Therapy:  Heparin level 0.3-0.7 units/ml Monitor platelets by anticoagulation protocol: Yes   Plan:  Continue heparin 1950 units/hr Daily heparin level and CBC.  58, PharmD Clinical Pharmacist **Pharmacist phone directory can now be found on [email or web address] ([email or web address]).  Listed under [facility].

## 2022-01-17 NOTE — Progress Notes (Signed)
Pt has been ambulating in room and hall with wife. Discussed IS (2500 ml+), sternal precautions, mobility post op, and d/c planning. Receptive. Gave OHS booklet and careguide. His wife will be with him at d/c, to stay at her house in [place]. [place], ACSM 2:19 PM 01/17/2022

## 2022-01-17 NOTE — Anesthesia Preprocedure Evaluation (Addendum)
Anesthesia Evaluation  ?Patient identified by MRN, date of birth, ID band ?Patient awake ? ? ? ?Reviewed: ?Allergy & Precautions, NPO status , Patient's Chart, lab work & pertinent test results ? ?History of Anesthesia Complications ?Negative for: history of anesthetic complications ? ?Airway ?Mallampati: I ? ?TM Distance: >3 FB ?Neck ROM: Full ? ? ? Dental ? ?(+) Dental Advisory Given ?  ?Pulmonary ? ?Intubated transiently during CPR ?  ?breath sounds clear to auscultation ? ? ? ? ? ? Cardiovascular ?+ CAD (3v ASCAD) and + Past MI (NSTEMI)  ? ?Rhythm:Regular Rate:Normal ? ?01/12/2022 VTach/Vfib arrest while working out on treadmill, 9min CPR ? ?01/13/2022 ECHO: EF 55-60%, no regional wall motion abnormalities, mild LVH, Grade 2 DD, normal RVF, no significant valvular abnormalities ?  ?Neuro/Psych ?negative neurological ROS ?   ? GI/Hepatic ?negative GI ROS, Elevated LFTs ?  ?Endo/Other  ?negative endocrine ROS ? Renal/GU ?Renal InsufficiencyRenal disease  ? ?  ?Musculoskeletal ? ? Abdominal ?  ?Peds ? Hematology ?negative hematology ROS ?(+)   ?Anesthesia Other Findings ? ? Reproductive/Obstetrics ? ?  ? ? ? ? ? ? ? ? ? ? ? ? ? ?  ?  ? ? ? ? ? ? ? ?Anesthesia Physical ?Anesthesia Plan ? ?ASA: 4 ? ?Anesthesia Plan: General  ? ?Post-op Pain Management:   ? ?Induction: Intravenous ? ?PONV Risk Score and Plan: 2 and Treatment may vary due to age or medical condition ? ?Airway Management Planned: Oral ETT ? ?Additional Equipment: Arterial line, PA Cath, TEE and Ultrasound Guidance Line Placement ? ?Intra-op Plan:  ? ?Post-operative Plan: Post-operative intubation/ventilation ? ?Informed Consent: I have reviewed the patients History and Physical, chart, labs and discussed the procedure including the risks, benefits and alternatives for the proposed anesthesia with the patient or authorized representative who has indicated his/her understanding and acceptance.  ? ? ? ?Dental advisory  given ? ?Plan Discussed with: CRNA and Surgeon ? ?Anesthesia Plan Comments:   ? ? ? ? ? ?Anesthesia Quick Evaluation ? ?

## 2022-01-17 NOTE — Progress Notes (Signed)
Progress Note   Patient: Jeremy Winters [id number] DOB: 12/14/1965 DOA: 01/12/2022     5 DOS: the patient was seen and examined on 01/17/2022   Brief hospital course: Patient is a 56 year old overweight Caucasian male with past medical history significant for but not limited to hypertension and hyperlipidemia as well as other comorbidities who was exercising at [employer] and then had a witnessed collapse.  Bystanders called EMS but did not perform CPR on the patient until 5 minutes prior to EMS arrival he was found to be in pulseless V. tach and received ACLS measures including 2 epis and defibrillations for V. tach versus V-fib prior to ROSC after 18 minutes with a King airway being placed.  Per the triage note EMS had reported some purposeful movement in route and he was given Versed for agitation.  Upon arrival to the ED EKG showed ST elevations in V1 and V2 with reciprocal changes in the lateral and inferior leads.  Patient was given amnio bolus and started on amiodarone drip.  He was then taken to the Cath Lab emergently and PCCM was consulted for mission after his Cath Lab.  He remained intubated and was extubated on 01/13/2022.  He was admitted for his V-fib/V. tach arrest and concern for STEMI but was actually deemed a NSTEMI and cardiology evaluated and left heart cath demonstrated complete occlusion of the left circumflex and good collaterals and cardiac arrest may have been stressed induced.  Cardiology is recommending continuing beta-blocker and heparin drip and IV amiodarone transitioned to p.o.  Patient is to be evaluated by TCTS for consideration of CABG.  Subsequently patient was transferred to [facility] service on 01/15/2022.  Cardiology recommends continuing aspirin, beta-blocker, heparin and statin for now  Cardiothoracic surgery evaluated and Dr. Rod Mae recommends that the multivessel surgical coronary revascularization would be the best long-term therapy for the patient.   Initially patient was to be transferred to [facility] given that he is in the process of moving to [facility] but patient has decided to stay here until his CABG is done and plan is to either go for CABG in the morning or on Wednesday.  In the interim we will continue heparin drip per cardiology recommendations and they recommend continuing amiodarone for now and likely discontinuing after revascularization.  Patient is now undergoing pre-CABG work-up and he had pre-CABG ultrasound Dopplers done of the carotids, vertebrals and upper extremities and had lower extremity pedal arterial waveforms lower within normal limits.  Plan is for CABG to be done on 01/18/2022.  Assessment and Plan: Abnormal LFTs -Patient's AST is now 67 and ALT is now 87 and trending down -Continue to Monitor and Trend and repeat CMP in the AM    Overweight with body mass index (BMI) [id number] -Complicates overall prognosis and care -Estimated body mass index is 29.96 kg/m as calculated from the following:   Height as of this encounter: 5\' 9"  (1.753 m).   Weight as of this encounter: 92 kg.  -Weight Loss and Dietary Counseling given   Thrombocytopenia (HCC) -Patient's Platelet Count went from 131 -> 141 -> 191 -> 181 -Continue to Monitor for S/Sx of Bleeding; No overt bleeding noted -Repeat CBC in the AM   Normocytic anemia -Patient's Hgb/Hct is now relatively Stable  -Hgb/Hct is now 12.1/35.7 -> 13.7/38.7 -> 13.8/37.9 -Check Anemia panel in the a.m. as expect to drop  -Continue to monitor for signs and symptoms of bleeding; currently no overt bleeding noted -Repeat CBC in a.m.  Leukocytosis -  Improving and likely reactive in the setting of Arrest -Patient's WBC went from 12.6 -> 10.7 -> 10.9 -> 9.7 -Continue to Monitor and Trend -Repeat CBC in the AM  Hyperglycemia -Hemoglobin A1c was 5.2 -Likely reactive in CBGs ranging from 104-111 -continue monitor blood sugars per protocol continue sliding scale  insulin  Acute respiratory failure with hypoxia (HCC) -Improving -No longer intubated and was extubated on 01/13/2022 -Remains on 2 L of supplemental oxygen nasal SpO2: 93 % O2 Flow Rate (L/min): 2 L/min FiO2 (%): 40 % -Continuous pulse oximetry maintain O2 saturation greater 90% -Continue supplemental oxygen via nasal cannula and wean to room air -We will need an amatory home O2 screen and repeat chest x-ray prior to discharge  Cardiogenic shock (HCC) -Resolved now -Repeat Echocardiogram shows normalization of LV function -Cardiology feels that this is less likely an apical variant hypertrophic cardiomyopathy -As above and further care per Cardiology  Essential hypertension - Patient's blood pressure has improved and is now 143/92 -Continue with Losartan 50 mg p.o. daily as well as Metoprolol 12.5 mg p.o. twice daily -Continue monitor blood pressures per protocol  Hyperlipidemia -Continue with his statin -Lipid panel done and showed total cholesterol/HDL ratio 5.1, cholesterol level 92, HDL 38, triglycerides of 286, VLDL 54  NSTEMI (non-ST elevated myocardial infarction) ([place]) - Initial concern for STEMI however cardiology feels that this is NSTEMI -Cardiology has the patient on aspirin, statin, beta-blocker as well as heparin and they are adding losartan -Cardiology feels that coronary bypass and graft will be the best option and able to ask the cardiothoracic surgeon to review and evaluate; TCTS agrees and original plan was to transfer the patient to [facility] for CABG given that he is in the process of moving to [facility] however he will stay at our hospital and get a CABG done and then once he is okay for cardiac rehab then will be discharged and have outpatient cardiac rehab at [facility]  -Further care per plans Cardiology and TCTS -C/w ASA 81 mg po Daily, Atorvastatin 80 mg po Daily, Losartan 50 mg po Daily, Heparin gtt, and Metoprolol 12.5 mg po BID   AKI (acute kidney  injury) ([place]) -Renal function is improving -Patient presented and had an AKI on admission is now resolved as his last BUN/creatinine is 10/1.06 -> 13/1.19 -> 16/1.21 -Avoid further nephrotoxic medications, contrast dyes if possible, hypotension and renally dose medications -Repeat CMP in the a.m.   Cardiopulmonary arrest with successful resuscitation [facility]) -Likely V-fib/V. tach arrest -Patient is status post CPR x18 minutes and did not receive CPR for approximately 5 minutes until EMS arrived.  There is reported purposeful movements in route and required sedation for agitation -Patient is status post left heart cath that demonstrated complete occlusion of the left circumflex with good collaterals and that cardiac arrest may have been stress-induced in setting of exercise -Cardiology is following and patient is being evaluated by the TCTS for CABG prior to discharge; patient is a candidate for CABG and cardiothoracic surgery recommends that this would be the best mode of revascularization.  Patient was to be transferred to [facility] for CABG given that he is in the process of moving to [facility] however he will not stay here and do the CABG here and then be going to [facility] for cardiac rehab -Currently remains on the heparin drip, beta-blocker and his amiodarone has been changed to p.o. -Cardiology recommends continue amiodarone for now and this was transitioned as above but likely recommending  that this can be discontinued following revascularization -Plan for CABG in the AM as patient is undergoing Pre-CABG workup today   Subjective: Seen and examined at bedside and doing fairly well.  Receiving his pre-CABG work-up.  Denies any nausea or vomiting.  Undergoing CABG in the morning.  No other concerns or points at this time.  Physical Exam: Vitals:   01/16/22 1959 01/17/22 0336 01/17/22 0337 01/17/22 1208  BP: 140/83  (!) 148/94 (!) 143/92  Pulse: 62  (!) 54 (!) 51  Resp: 20  18 (!) 21   Temp: 98 F (36.7 C)  98.3 F (36.8 C) 98 F (36.7 C)  TempSrc: Oral  Oral Oral  SpO2: 94%  95% 94%  Weight:  89.2 kg    Height:       Examination: Physical Exam:  Constitutional: WN/WD overweight Caucasian male currently in no acute distress appears calm Respiratory: Diminished to auscultation bilaterally with coarse breath sounds, no wheezing, rales, rhonchi or crackles. Normal respiratory effort and patient is not tachypenic. No accessory muscle use.  Cardiovascular: RRR, no murmurs / rubs / gallops. S1 and S2 auscultated. 1+ LE edema Abdomen: Soft, non-tender, Distended 2/2 body habitus. Bowel sounds positive.  GU: Deferred. Musculoskeletal: No clubbing / cyanosis of digits/nails. No joint deformity upper and lower extremities.  Skin: No rashes, lesions, ulcers on a limited skin evaluation. No induration; Warm and dry.   Data Reviewed:  I have independently reviewed and assessed the patient's clinical  Family Communication: Discussed with wife at bedside  Disposition: Status is: Inpatient Remains inpatient appropriate because: Patient going for CABG in the AM   Planned Discharge Destination:  TBD  as will need PT/OT after SNF  DVT Prophylaxis: Anticoagulated with a Heparin gtt  Author: Raiford Noble, DO [facility]  01/17/2022 3:33 PM  For on call review [email or web address].

## 2022-01-17 NOTE — Assessment & Plan Note (Signed)
-  Patient's AST is now 67 and ALT is now 87 and trending down -Continue to Monitor and Trend and repeat CMP in the AM

## 2022-01-17 NOTE — Progress Notes (Signed)
5 Days Post-Op Procedure(s) (LRB): RIGHT/LEFT HEART CATH AND CORONARY ANGIOGRAPHY (N/A) Subjective: Patient doing well without angina. Ambulating in hallway. Pre-CABG Dopplers show no significant carotid disease or subclavian arterial disease PNL chest x-ray today shows clear lung fields Patient states he is ready for surgery and understands the benefits and risks.  Objective: Vital signs in last 24 hours: Temp:  [98 F (36.7 C)-98.3 F (36.8 C)] 98 F (36.7 C) (02/28 1208) Pulse Rate:  [51-62] 51 (02/28 1208) Cardiac Rhythm: Sinus bradycardia;Bundle branch block (02/28 0848) Resp:  [18-21] 21 (02/28 1208) BP: (140-148)/(83-94) 143/92 (02/28 1208) SpO2:  [94 %-95 %] 94 % (02/28 1208) Weight:  [89.2 kg] 89.2 kg (02/28 0336)  Hemodynamic parameters for last 24 hours: Sinus rhythm  Intake/Output from previous day: 02/27 0701 - 02/28 0700 In: 1249.5 [P.O.:840; I.V.:409.5] Out: 2900 [Urine:2900] Intake/Output this shift: Total I/O In: 480 [P.O.:480] Out: 3300 [Urine:3300]      Physical Exam  General: Healthy-appearing middle-aged male no acute distress HEENT: Normocephalic pupils equal , dentition adequate Neck: Supple without JVD, adenopathy, or bruit Chest: Clear to auscultation, symmetrical breath sounds, no rhonchi, no tenderness             or deformity Cardiovascular: Regular rate and rhythm, no murmur, no gallop, peripheral pulses             palpable in all extremities Abdomen:  Soft, nontender, no palpable mass or organomegaly Extremities: Warm, well-perfused, no clubbing cyanosis edema or tenderness,              no venous stasis changes of the legs Rectal/GU: Deferred Neuro: Grossly non--focal and symmetrical throughout Skin: Clean and dry without rash or ulceration   Lab Results: Recent Labs    01/16/22 0742 01/17/22 0314  WBC 10.9* 9.7  HGB 13.7 13.8  HCT 38.7* 37.9*  PLT 191 181   BMET:  Recent Labs    01/16/22 0742 01/17/22 0314  NA 139 139   K 3.9 3.7  CL 105 104  CO2 24 25  GLUCOSE 101* 101*  BUN 13 16  CREATININE 1.19 1.21  CALCIUM 8.5* 8.5*    PT/INR: No results for input(s): LABPROT, INR in the last 72 hours. ABG    Component Value Date/Time   PHART 7.319 (L) 01/12/2022 2036   HCO3 23.2 01/12/2022 2036   TCO2 25 01/12/2022 2036   ACIDBASEDEF 3.0 (H) 01/12/2022 2036   O2SAT 79.0 01/13/2022 0625   CBG (last 3)  Recent Labs    01/15/22 0657 01/15/22 1550 01/15/22 2113  GLUCAP 104* 108* 111*    Assessment/Plan: S/P Procedure(s) (LRB): RIGHT/LEFT HEART CATH AND CORONARY ANGIOGRAPHY (N/A) Plan on proceeding with multivessel CABG in a.m.  We discussed the details of surgery, the expected recovery in the hospital, and the potential risks and expected benefits.  He agrees to proceed with CABG in a.m.   LOS: 5 days    Lovett Sox 01/17/2022

## 2022-01-17 NOTE — Progress Notes (Signed)
Pre-CABG Dopplers completed. Refer to "CV Proc" under chart review to view preliminary results.  01/17/2022 11:05 AM Kelby Aline., MHA, RVT, RDCS, RDMS

## 2022-01-17 NOTE — Progress Notes (Addendum)
Progress Note  Patient Name: Jeremy Winters Date of Encounter: 01/17/2022  [facility] Cardiologist: Peter Martinique, MD   Subjective   Feeling well. No chest pain, sob or palpitations.    Inpatient Medications    Scheduled Meds:  amiodarone  200 mg Oral BID   aspirin  81 mg Oral Daily   atorvastatin  80 mg Oral Daily   chlorhexidine gluconate (MEDLINE KIT)  15 mL Mouth Rinse BID   Chlorhexidine Gluconate Cloth  6 each Topical Daily   docusate sodium  100 mg Oral BID   losartan  50 mg Oral Daily   mouth rinse  15 mL Mouth Rinse BID   metoprolol tartrate  12.5 mg Oral BID   mupirocin ointment  1 application Nasal BID   pantoprazole  40 mg Oral QHS   polyethylene glycol  17 g Oral Daily   sodium chloride flush  3 mL Intravenous Q12H   Continuous Infusions:  sodium chloride     heparin 1,950 Units/hr (01/17/22 0717)   PRN Meds: sodium chloride, acetaminophen, ondansetron (ZOFRAN) IV, prochlorperazine, sodium chloride flush   Vital Signs    Vitals:   01/16/22 1152 01/16/22 1959 01/17/22 0336 01/17/22 0337  BP: (!) 159/90 140/83  (!) 148/94  Pulse: (!) 57 62  (!) 54  Resp: $Remo'16 20  18  'eOJvl$ Temp: 98.3 F (36.8 C) 98 F (36.7 C)  98.3 F (36.8 C)  TempSrc: Oral Oral  Oral  SpO2: 95% 94%  95%  Weight:   89.2 kg   Height:        Intake/Output Summary (Last 24 hours) at 01/17/2022 0856 Last data filed at 01/17/2022 0718 Gross per 24 hour  Intake 1129.53 ml  Output 3300 ml  Net -2170.47 ml   Last 3 Weights 01/17/2022 01/16/2022 01/15/2022  Weight (lbs) 196 lb 11.2 oz 198 lb 3.2 oz 200 lb 6.4 oz  Weight (kg) 89.223 kg 89.903 kg 90.9 kg      Telemetry    Sinus bradycardia  - Personally Reviewed  ECG    N/A  Physical Exam   GEN: No acute distress.   Neck: No JVD Cardiac: RRR, no murmurs, rubs, or gallops.  Respiratory: Clear to auscultation bilaterally. GI: Soft, nontender, non-distended  MS: No edema; No deformity. Neuro:  Nonfocal  Psych: Normal affect    Labs    High Sensitivity Troponin:   Recent Labs  Lab 01/12/22 1653 01/12/22 2044 01/13/22 0005 01/13/22 0249  TROPONINIHS 49* 6,634* 9,382* 9,472*     Chemistry Recent Labs  Lab 01/12/22 1653 01/12/22 1729 01/12/22 2044 01/12/22 2147 01/13/22 0249 01/14/22 0450 01/15/22 0305 01/16/22 0742 01/17/22 0314  NA 138   < >  --    < > 136   < > 139 139 139  K 3.8   < > 6.6*   < > 4.1   < > 4.1 3.9 3.7  CL 101   < >  --    < > 103   < > 105 105 104  CO2 19*  --   --    < > 23   < > $R'26 24 25  'bP$ GLUCOSE 238*   < >  --    < > 166*   < > 112* 101* 101*  BUN 21*   < >  --    < > 22*   < > $R'10 13 16  'wu$ CREATININE 1.64*   < >  --    < > 1.36*   < >  1.06 1.19 1.21  CALCIUM 8.4*  --   --    < > 7.9*   < > 8.2* 8.5* 8.5*  MG 2.3  --  2.2  --  2.0  --   --   --  2.1  PROT 6.9  --   --   --  6.1*  --   --   --  6.4*  ALBUMIN 3.8  --   --   --  3.4*  --   --   --  3.2*  AST 143*  --   --   --  130*  --   --   --  67*  ALT 152*  --   --   --  142*  --   --   --  87*  ALKPHOS 56  --   --   --  48  --   --   --  49  BILITOT 0.7  --   --   --  0.8  --   --   --  1.0  GFRNONAA 49*  --   --    < > >60   < > >60 >60 >60  ANIONGAP 18*  --   --    < > 10   < > $R'8 10 10   'tu$ < > = values in this interval not displayed.    Lipids  Recent Labs  Lab 01/13/22 0000 01/13/22 0249  CHOL 192  --   TRIG 270* 286*  HDL 38*  --   LDLCALC 100*  --   CHOLHDL 5.1  --     Hematology Recent Labs  Lab 01/15/22 0305 01/16/22 0742 01/17/22 0314  WBC 10.7* 10.9* 9.7  RBC 4.04* 4.53 4.50  HGB 12.1* 13.7 13.8  HCT 35.7* 38.7* 37.9*  MCV 88.4 85.4 84.2  MCH 30.0 30.2 30.7  MCHC 33.9 35.4 36.4*  RDW 12.7 12.3 12.5  PLT 141* 191 181   Thyroid  Recent Labs  Lab 01/17/22 0314  TSH 6.308*     Radiology    DG Chest 2 View  Result Date: 01/17/2022 CLINICAL DATA:  Chest pain EXAM: CHEST - 2 VIEW COMPARISON:  01/13/2022 FINDINGS: Interval extubation and removal of the Swan-Ganz catheter. Pulmonary  insufflation is improved with the lung symmetrically well expanded. Small bilateral pleural effusions are present. Mild bibasilar atelectasis. No pneumothorax. Cardiac size within normal limits. Right clavicular ORIF has been performed. IMPRESSION: Small bilateral pleural effusions.  Minimal bibasilar atelectasis. Electronically Signed   By: Fidela Salisbury M.D.   On: 01/17/2022 03:53    Cardiac Studies   Echo 01/13/22 1. 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  Left ventricular ejection fraction, by estimation, is 55 to 60%. The left  ventricle has normal function. The left ventricle has no regional wall  motion abnormalities. The left  ventricular internal cavity size was mildly dilated. There is mild left  ventricular hypertrophy. Left ventricular diastolic parameters are  consistent with Grade II diastolic dysfunction (pseudonormalization).  There is the interventricular septum is  flattened in diastole ('D' shaped left ventricle), consistent with right  ventricular volume overload.   2. Right ventricular systolic function is normal. The right ventricular  size is normal. There is moderately elevated pulmonary artery systolic  pressure. The estimated right ventricular systolic pressure is 70.7 mmHg.   3. The mitral valve is normal in structure. No evidence of mitral valve  regurgitation. No evidence of mitral stenosis.   4. The  aortic valve is calcified. There is mild calcification of the  aortic valve. There is mild thickening of the aortic valve. Aortic valve  regurgitation is not visualized. Aortic valve sclerosis is present, with  no evidence of aortic valve stenosis.   5. The inferior vena cava is normal in size with greater than 50%  respiratory variability, suggesting right atrial pressure of 3 mmHg.   RIGHT/LEFT HEART CATH AND CORONARY ANGIOGRAPHY  01/12/22   Conclusion      Mid LM to Dist LM lesion is 30% stenosed.   Dist LAD lesion is 90%  stenosed.   Prox Cx to Mid Cx lesion is 100% stenosed.   Prox RCA to Mid RCA lesion is 60% stenosed.   Dist RCA lesion is 80% stenosed.   The left ventricular ejection fraction is 35-45% by visual estimate.   3 vessel obstructive CAD. There is 90% stenosis in the distal LAD. The LCX is a moderately large vessel with 100% mid vessel CTO with well established collaterals. The RCA has an 80% distal lesion. LV function is mild to moderately reduced but may not represent true function since patient was significantly tachycardic at the time.  Low/normal LV filling pressures with PCWP mean of 7.  Normal right heart pressures. Low cardiac output with Index 1.65.    Plan: his cardiac arrest appears to be related to demand ischemia ( walking on treadmill in presence of chronic severe CAD). Recommend medical therapy for now. With recovery of neurologic function we can decide ultimate revascularization strategy. Will continue IV amiodarone. Resume Heparin in 8 hours post sheath removal. ASA and statin therapy. CCM to manage vent and critical care issues.   Diagnostic Dominance: Right   Patient Profile     56 y.o. male with past medical history of hypertension, hyperlipidemia admitted following cardiac arrest.   Assessment & Plan    VT Cardiac arrest - Arrest was felt secondary to demand ischemia (walking on treadmill in presence of chronic severe coronary artery disease).  - Treated with IV amiodarone, now on PO, likely DC prior to discharge   2. CAD - Cath as above with 3 V CAD. Felt CABG is best  option. Patient initially wanted to  transferred to [place] for CABG, but patient and his wife would rather have the surgery done here. For CABG tomorrow.  - Echo with normal LVEF - Continue ASA, Statin, BB and losartan  - Continue heparin  - No chest pain   3.Cardiogenic shock 4. AKI -resolved  5. HLD - 01/13/2022: Cholesterol 192; HDL 38; LDL Cholesterol 100; Triglycerides 286; VLDL 54   - Continue statin  For questions or updates, please contact [place] Please consult [email or web address] for contact info under       SignedLeanor Kail, PA  01/17/2022, 8:56 AM     Patient seen and examined. Agree with assessment and plan.  No recurrent anginal symptomatology.  Very mild pleuritic symptoms with deep cough.  He is on metoprolol tartrate, losartan 50 mg daily, and continues to be on amiodarone with maintenance of sinus rhythm.  LFTs are improving.  Plan for CABG revascularization surgery with Dr. Tharon Aquas Tright tomorrow.   Troy Sine, MD, [facility] 01/17/2022 10:53 AM

## 2022-01-18 ENCOUNTER — Other Ambulatory Visit: Payer: Self-pay

## 2022-01-18 ENCOUNTER — Inpatient Hospital Stay (HOSPITAL_COMMUNITY): Payer: 59

## 2022-01-18 ENCOUNTER — Encounter (HOSPITAL_COMMUNITY): Payer: Self-pay | Admitting: Pulmonary Disease

## 2022-01-18 ENCOUNTER — Inpatient Hospital Stay (HOSPITAL_COMMUNITY)
Admission: EM | Disposition: A | Discharge status: Home / Self Care | Payer: Self-pay | Source: Home / Self Care | Attending: Cardiothoracic Surgery

## 2022-01-18 DIAGNOSIS — I2510 Atherosclerotic heart disease of native coronary artery without angina pectoris: Secondary | ICD-10-CM

## 2022-01-18 DIAGNOSIS — Z951 Presence of aortocoronary bypass graft: Secondary | ICD-10-CM

## 2022-01-18 HISTORY — PX: CORONARY ARTERY BYPASS GRAFT: SHX141

## 2022-01-18 HISTORY — PX: TEE WITHOUT CARDIOVERSION: SHX5443

## 2022-01-18 LAB — POCT I-STAT, CHEM 8
BUN: 13 mg/dL (ref 6–20)
BUN: 12 mg/dL (ref 6–20)
BUN: 13 mg/dL (ref 6–20)
BUN: 12 mg/dL (ref 6–20)
BUN: 11 mg/dL (ref 6–20)
BUN: 10 mg/dL (ref 6–20)
BUN: 11 mg/dL (ref 6–20)
Calcium, Ion: 1.2 mmol/L (ref 1.15–1.40)
Calcium, Ion: 0.95 mmol/L — ABNORMAL LOW (ref 1.15–1.40)
Calcium, Ion: 1.17 mmol/L (ref 1.15–1.40)
Calcium, Ion: 0.85 mmol/L — CL (ref 1.15–1.40)
Calcium, Ion: 0.93 mmol/L — ABNORMAL LOW (ref 1.15–1.40)
Calcium, Ion: 0.86 mmol/L — CL (ref 1.15–1.40)
Calcium, Ion: 1.02 mmol/L — ABNORMAL LOW (ref 1.15–1.40)
Chloride: 105 mmol/L (ref 98–111)
Chloride: 104 mmol/L (ref 98–111)
Chloride: 98 mmol/L (ref 98–111)
Chloride: 102 mmol/L (ref 98–111)
Chloride: 99 mmol/L (ref 98–111)
Chloride: 100 mmol/L (ref 98–111)
Chloride: 99 mmol/L (ref 98–111)
Creatinine, Ser: 1.1 mg/dL (ref 0.61–1.24)
Creatinine, Ser: 1 mg/dL (ref 0.61–1.24)
Creatinine, Ser: 1.2 mg/dL (ref 0.61–1.24)
Creatinine, Ser: 0.9 mg/dL (ref 0.61–1.24)
Creatinine, Ser: 0.9 mg/dL (ref 0.61–1.24)
Creatinine, Ser: 0.8 mg/dL (ref 0.61–1.24)
Creatinine, Ser: 1 mg/dL (ref 0.61–1.24)
Glucose, Bld: 104 mg/dL — ABNORMAL HIGH (ref 70–99)
Glucose, Bld: 103 mg/dL — ABNORMAL HIGH (ref 70–99)
Glucose, Bld: 108 mg/dL — ABNORMAL HIGH (ref 70–99)
Glucose, Bld: 119 mg/dL — ABNORMAL HIGH (ref 70–99)
Glucose, Bld: 121 mg/dL — ABNORMAL HIGH (ref 70–99)
Glucose, Bld: 117 mg/dL — ABNORMAL HIGH (ref 70–99)
Glucose, Bld: 123 mg/dL — ABNORMAL HIGH (ref 70–99)
HCT: 35 % — ABNORMAL LOW (ref 39.0–52.0)
HCT: 27 % — ABNORMAL LOW (ref 39.0–52.0)
HCT: 35 % — ABNORMAL LOW (ref 39.0–52.0)
HCT: 26 % — ABNORMAL LOW (ref 39.0–52.0)
HCT: 26 % — ABNORMAL LOW (ref 39.0–52.0)
HCT: 24 % — ABNORMAL LOW (ref 39.0–52.0)
HCT: 25 % — ABNORMAL LOW (ref 39.0–52.0)
Hemoglobin: 11.9 g/dL — ABNORMAL LOW (ref 13.0–17.0)
Hemoglobin: 11.9 g/dL — ABNORMAL LOW (ref 13.0–17.0)
Hemoglobin: 9.2 g/dL — ABNORMAL LOW (ref 13.0–17.0)
Hemoglobin: 8.8 g/dL — ABNORMAL LOW (ref 13.0–17.0)
Hemoglobin: 8.8 g/dL — ABNORMAL LOW (ref 13.0–17.0)
Hemoglobin: 8.2 g/dL — ABNORMAL LOW (ref 13.0–17.0)
Hemoglobin: 8.5 g/dL — ABNORMAL LOW (ref 13.0–17.0)
Potassium: 4.2 mmol/L (ref 3.5–5.1)
Potassium: 4.5 mmol/L (ref 3.5–5.1)
Potassium: 4.6 mmol/L (ref 3.5–5.1)
Potassium: 4.8 mmol/L (ref 3.5–5.1)
Potassium: 4.7 mmol/L (ref 3.5–5.1)
Potassium: 3.9 mmol/L (ref 3.5–5.1)
Potassium: 4.3 mmol/L (ref 3.5–5.1)
Sodium: 140 mmol/L (ref 135–145)
Sodium: 137 mmol/L (ref 135–145)
Sodium: 138 mmol/L (ref 135–145)
Sodium: 137 mmol/L (ref 135–145)
Sodium: 136 mmol/L (ref 135–145)
Sodium: 137 mmol/L (ref 135–145)
Sodium: 140 mmol/L (ref 135–145)
TCO2: 28 mmol/L (ref 22–32)
TCO2: 28 mmol/L (ref 22–32)
TCO2: 29 mmol/L (ref 22–32)
TCO2: 28 mmol/L (ref 22–32)
TCO2: 27 mmol/L (ref 22–32)
TCO2: 25 mmol/L (ref 22–32)
TCO2: 29 mmol/L (ref 22–32)

## 2022-01-18 LAB — POCT I-STAT 7, (LYTES, BLD GAS, ICA,H+H)
Acid-Base Excess: 1 mmol/L (ref 0.0–2.0)
Acid-Base Excess: 2 mmol/L (ref 0.0–2.0)
Acid-Base Excess: 2 mmol/L (ref 0.0–2.0)
Acid-Base Excess: 2 mmol/L (ref 0.0–2.0)
Acid-Base Excess: 2 mmol/L (ref 0.0–2.0)
Acid-Base Excess: 0 mmol/L (ref 0.0–2.0)
Acid-Base Excess: 2 mmol/L (ref 0.0–2.0)
Acid-Base Excess: 1 mmol/L (ref 0.0–2.0)
Acid-Base Excess: 0 mmol/L (ref 0.0–2.0)
Acid-base deficit: 1 mmol/L (ref 0.0–2.0)
Bicarbonate: 27 mmol/L (ref 20.0–28.0)
Bicarbonate: 27.5 mmol/L (ref 20.0–28.0)
Bicarbonate: 26.1 mmol/L (ref 20.0–28.0)
Bicarbonate: 26.1 mmol/L (ref 20.0–28.0)
Bicarbonate: 26.5 mmol/L (ref 20.0–28.0)
Bicarbonate: 25.4 mmol/L (ref 20.0–28.0)
Bicarbonate: 27.4 mmol/L (ref 20.0–28.0)
Bicarbonate: 26.2 mmol/L (ref 20.0–28.0)
Bicarbonate: 23.3 mmol/L (ref 20.0–28.0)
Bicarbonate: 24.7 mmol/L (ref 20.0–28.0)
Calcium, Ion: 1.17 mmol/L (ref 1.15–1.40)
Calcium, Ion: 0.96 mmol/L — ABNORMAL LOW (ref 1.15–1.40)
Calcium, Ion: 0.92 mmol/L — ABNORMAL LOW (ref 1.15–1.40)
Calcium, Ion: 0.92 mmol/L — ABNORMAL LOW (ref 1.15–1.40)
Calcium, Ion: 0.92 mmol/L — ABNORMAL LOW (ref 1.15–1.40)
Calcium, Ion: 0.89 mmol/L — CL (ref 1.15–1.40)
Calcium, Ion: 1.01 mmol/L — ABNORMAL LOW (ref 1.15–1.40)
Calcium, Ion: 1.01 mmol/L — ABNORMAL LOW (ref 1.15–1.40)
Calcium, Ion: 0.97 mmol/L — ABNORMAL LOW (ref 1.15–1.40)
Calcium, Ion: 1.03 mmol/L — ABNORMAL LOW (ref 1.15–1.40)
HCT: 38 % — ABNORMAL LOW (ref 39.0–52.0)
HCT: 26 % — ABNORMAL LOW (ref 39.0–52.0)
HCT: 26 % — ABNORMAL LOW (ref 39.0–52.0)
HCT: 27 % — ABNORMAL LOW (ref 39.0–52.0)
HCT: 26 % — ABNORMAL LOW (ref 39.0–52.0)
HCT: 25 % — ABNORMAL LOW (ref 39.0–52.0)
HCT: 28 % — ABNORMAL LOW (ref 39.0–52.0)
HCT: 25 % — ABNORMAL LOW (ref 39.0–52.0)
HCT: 23 % — ABNORMAL LOW (ref 39.0–52.0)
HCT: 24 % — ABNORMAL LOW (ref 39.0–52.0)
Hemoglobin: 12.9 g/dL — ABNORMAL LOW (ref 13.0–17.0)
Hemoglobin: 8.8 g/dL — ABNORMAL LOW (ref 13.0–17.0)
Hemoglobin: 8.8 g/dL — ABNORMAL LOW (ref 13.0–17.0)
Hemoglobin: 9.2 g/dL — ABNORMAL LOW (ref 13.0–17.0)
Hemoglobin: 8.8 g/dL — ABNORMAL LOW (ref 13.0–17.0)
Hemoglobin: 8.5 g/dL — ABNORMAL LOW (ref 13.0–17.0)
Hemoglobin: 9.5 g/dL — ABNORMAL LOW (ref 13.0–17.0)
Hemoglobin: 8.5 g/dL — ABNORMAL LOW (ref 13.0–17.0)
Hemoglobin: 7.8 g/dL — ABNORMAL LOW (ref 13.0–17.0)
Hemoglobin: 8.2 g/dL — ABNORMAL LOW (ref 13.0–17.0)
O2 Saturation: 100 %
O2 Saturation: 100 %
O2 Saturation: 100 %
O2 Saturation: 100 %
O2 Saturation: 100 %
O2 Saturation: 100 %
O2 Saturation: 98 %
O2 Saturation: 96 %
O2 Saturation: 97 %
O2 Saturation: 96 %
Patient temperature: 36.7
Patient temperature: 37.5
Patient temperature: 37.1
Potassium: 4.1 mmol/L (ref 3.5–5.1)
Potassium: 4.9 mmol/L (ref 3.5–5.1)
Potassium: 4.9 mmol/L (ref 3.5–5.1)
Potassium: 5.3 mmol/L — ABNORMAL HIGH (ref 3.5–5.1)
Potassium: 4.6 mmol/L (ref 3.5–5.1)
Potassium: 3.9 mmol/L (ref 3.5–5.1)
Potassium: 4.1 mmol/L (ref 3.5–5.1)
Potassium: 4.3 mmol/L (ref 3.5–5.1)
Potassium: 4.1 mmol/L (ref 3.5–5.1)
Potassium: 4.4 mmol/L (ref 3.5–5.1)
Sodium: 140 mmol/L (ref 135–145)
Sodium: 137 mmol/L (ref 135–145)
Sodium: 136 mmol/L (ref 135–145)
Sodium: 137 mmol/L (ref 135–145)
Sodium: 135 mmol/L (ref 135–145)
Sodium: 139 mmol/L (ref 135–145)
Sodium: 140 mmol/L (ref 135–145)
Sodium: 139 mmol/L (ref 135–145)
Sodium: 139 mmol/L (ref 135–145)
Sodium: 138 mmol/L (ref 135–145)
TCO2: 28 mmol/L (ref 22–32)
TCO2: 29 mmol/L (ref 22–32)
TCO2: 27 mmol/L (ref 22–32)
TCO2: 27 mmol/L (ref 22–32)
TCO2: 28 mmol/L (ref 22–32)
TCO2: 27 mmol/L (ref 22–32)
TCO2: 29 mmol/L (ref 22–32)
TCO2: 28 mmol/L (ref 22–32)
TCO2: 24 mmol/L (ref 22–32)
TCO2: 26 mmol/L (ref 22–32)
pCO2 arterial: 46.4 mmHg (ref 32–48)
pCO2 arterial: 47.8 mmHg (ref 32–48)
pCO2 arterial: 38.3 mmHg (ref 32–48)
pCO2 arterial: 40.2 mmHg (ref 32–48)
pCO2 arterial: 38.7 mmHg (ref 32–48)
pCO2 arterial: 43.1 mmHg (ref 32–48)
pCO2 arterial: 43.9 mmHg (ref 32–48)
pCO2 arterial: 42.2 mmHg (ref 32–48)
pCO2 arterial: 39 mmHg (ref 32–48)
pCO2 arterial: 39 mmHg (ref 32–48)
pH, Arterial: 7.373 (ref 7.35–7.45)
pH, Arterial: 7.368 (ref 7.35–7.45)
pH, Arterial: 7.421 (ref 7.35–7.45)
pH, Arterial: 7.442 (ref 7.35–7.45)
pH, Arterial: 7.444 (ref 7.35–7.45)
pH, Arterial: 7.378 (ref 7.35–7.45)
pH, Arterial: 7.404 (ref 7.35–7.45)
pH, Arterial: 7.4 (ref 7.35–7.45)
pH, Arterial: 7.386 (ref 7.35–7.45)
pH, Arterial: 7.41 (ref 7.35–7.45)
pO2, Arterial: 253 mmHg — ABNORMAL HIGH (ref 83–108)
pO2, Arterial: 300 mmHg — ABNORMAL HIGH (ref 83–108)
pO2, Arterial: 240 mmHg — ABNORMAL HIGH (ref 83–108)
pO2, Arterial: 269 mmHg — ABNORMAL HIGH (ref 83–108)
pO2, Arterial: 256 mmHg — ABNORMAL HIGH (ref 83–108)
pO2, Arterial: 102 mmHg (ref 83–108)
pO2, Arterial: 246 mmHg — ABNORMAL HIGH (ref 83–108)
pO2, Arterial: 82 mmHg — ABNORMAL LOW (ref 83–108)
pO2, Arterial: 95 mmHg (ref 83–108)
pO2, Arterial: 83 mmHg (ref 83–108)

## 2022-01-18 LAB — CBC WITH DIFFERENTIAL/PLATELET
Abs Immature Granulocytes: 0.44 10*3/uL — ABNORMAL HIGH (ref 0.00–0.07)
Basophils Absolute: 0.2 10*3/uL — ABNORMAL HIGH (ref 0.0–0.1)
Basophils Relative: 2 %
Eosinophils Absolute: 0.6 10*3/uL — ABNORMAL HIGH (ref 0.0–0.5)
Eosinophils Relative: 5 %
HCT: 41.3 % (ref 39.0–52.0)
Hemoglobin: 15.1 g/dL (ref 13.0–17.0)
Immature Granulocytes: 4 %
Lymphocytes Relative: 24 %
Lymphs Abs: 2.8 10*3/uL (ref 0.7–4.0)
MCH: 30.9 pg (ref 26.0–34.0)
MCHC: 36.6 g/dL — ABNORMAL HIGH (ref 30.0–36.0)
MCV: 84.5 fL (ref 80.0–100.0)
Monocytes Absolute: 1.1 10*3/uL — ABNORMAL HIGH (ref 0.1–1.0)
Monocytes Relative: 10 %
Neutro Abs: 6.5 10*3/uL (ref 1.7–7.7)
Neutrophils Relative %: 55 %
Platelets: 194 10*3/uL (ref 150–400)
RBC: 4.89 MIL/uL (ref 4.22–5.81)
RDW: 12.8 % (ref 11.5–15.5)
WBC: 11.6 10*3/uL — ABNORMAL HIGH (ref 4.0–10.5)
nRBC: 0.2 % (ref 0.0–0.2)

## 2022-01-18 LAB — COMPREHENSIVE METABOLIC PANEL
ALT: 133 U/L — ABNORMAL HIGH (ref 0–44)
AST: 79 U/L — ABNORMAL HIGH (ref 15–41)
Albumin: 3.6 g/dL (ref 3.5–5.0)
Alkaline Phosphatase: 54 U/L (ref 38–126)
Anion gap: 9 (ref 5–15)
BUN: 13 mg/dL (ref 6–20)
CO2: 26 mmol/L (ref 22–32)
Calcium: 9 mg/dL (ref 8.9–10.3)
Chloride: 104 mmol/L (ref 98–111)
Creatinine, Ser: 1.32 mg/dL — ABNORMAL HIGH (ref 0.61–1.24)
GFR, Estimated: >60 mL/min (ref >60)
Glucose, Bld: 106 mg/dL — ABNORMAL HIGH (ref 70–99)
Potassium: 4.3 mmol/L (ref 3.5–5.1)
Sodium: 139 mmol/L (ref 135–145)
Total Bilirubin: 1 mg/dL (ref 0.3–1.2)
Total Protein: 7 g/dL (ref 6.5–8.1)

## 2022-01-18 LAB — CBC
HCT: 26.1 % — ABNORMAL LOW (ref 39.0–52.0)
HCT: 24.2 % — ABNORMAL LOW (ref 39.0–52.0)
Hemoglobin: 9.5 g/dL — ABNORMAL LOW (ref 13.0–17.0)
Hemoglobin: 8.5 g/dL — ABNORMAL LOW (ref 13.0–17.0)
MCH: 31.4 pg (ref 26.0–34.0)
MCH: 30.7 pg (ref 26.0–34.0)
MCHC: 36.4 g/dL — ABNORMAL HIGH (ref 30.0–36.0)
MCHC: 35.1 g/dL (ref 30.0–36.0)
MCV: 86.1 fL (ref 80.0–100.0)
MCV: 87.4 fL (ref 80.0–100.0)
Platelets: 127 10*3/uL — ABNORMAL LOW (ref 150–400)
Platelets: 125 10*3/uL — ABNORMAL LOW (ref 150–400)
RBC: 3.03 MIL/uL — ABNORMAL LOW (ref 4.22–5.81)
RBC: 2.77 MIL/uL — ABNORMAL LOW (ref 4.22–5.81)
RDW: 12.9 % (ref 11.5–15.5)
RDW: 12.7 % (ref 11.5–15.5)
WBC: 16.2 10*3/uL — ABNORMAL HIGH (ref 4.0–10.5)
WBC: 14.4 10*3/uL — ABNORMAL HIGH (ref 4.0–10.5)
nRBC: 0 % (ref 0.0–0.2)
nRBC: 0 % (ref 0.0–0.2)

## 2022-01-18 LAB — ECHO INTRAOPERATIVE TEE
AV Mean grad: 4 mmHg
Height: 69 in
Weight: 3139.35 oz

## 2022-01-18 LAB — GLUCOSE, CAPILLARY
Glucose-Capillary: 130 mg/dL — ABNORMAL HIGH (ref 70–99)
Glucose-Capillary: 135 mg/dL — ABNORMAL HIGH (ref 70–99)
Glucose-Capillary: 135 mg/dL — ABNORMAL HIGH (ref 70–99)
Glucose-Capillary: 133 mg/dL — ABNORMAL HIGH (ref 70–99)
Glucose-Capillary: 176 mg/dL — ABNORMAL HIGH (ref 70–99)

## 2022-01-18 LAB — MAGNESIUM
Magnesium: 2.2 mg/dL (ref 1.7–2.4)
Magnesium: 3 mg/dL — ABNORMAL HIGH (ref 1.7–2.4)

## 2022-01-18 LAB — COOXEMETRY PANEL
Carboxyhemoglobin: <0.3 % — ABNORMAL LOW (ref 0.5–1.5)
Methemoglobin: 2.9 % — ABNORMAL HIGH (ref 0.0–1.5)
O2 Saturation: 71.9 %
Total hemoglobin: 9.5 g/dL — ABNORMAL LOW (ref 12.0–16.0)

## 2022-01-18 LAB — PROTIME-INR
INR: 1 (ref 0.8–1.2)
INR: 1.4 — ABNORMAL HIGH (ref 0.8–1.2)
Prothrombin Time: 13.1 seconds (ref 11.4–15.2)
Prothrombin Time: 16.7 seconds — ABNORMAL HIGH (ref 11.4–15.2)

## 2022-01-18 LAB — BASIC METABOLIC PANEL
Anion gap: 7 (ref 5–15)
BUN: 13 mg/dL (ref 6–20)
CO2: 25 mmol/L (ref 22–32)
Calcium: 7 mg/dL — ABNORMAL LOW (ref 8.9–10.3)
Chloride: 105 mmol/L (ref 98–111)
Creatinine, Ser: 1.16 mg/dL (ref 0.61–1.24)
GFR, Estimated: >60 mL/min (ref >60)
Glucose, Bld: 140 mg/dL — ABNORMAL HIGH (ref 70–99)
Potassium: 4.5 mmol/L (ref 3.5–5.1)
Sodium: 137 mmol/L (ref 135–145)

## 2022-01-18 LAB — CULTURE, BLOOD (ROUTINE X 2)
Culture: NO GROWTH
Culture: NO GROWTH
Culture: NO GROWTH
Special Requests: ADEQUATE

## 2022-01-18 LAB — HEMOGLOBIN AND HEMATOCRIT, BLOOD
HCT: 26.2 % — ABNORMAL LOW (ref 39.0–52.0)
Hemoglobin: 9.6 g/dL — ABNORMAL LOW (ref 13.0–17.0)

## 2022-01-18 LAB — RETICULOCYTES
Immature Retic Fract: 38.9 % — ABNORMAL HIGH (ref 2.3–15.9)
RBC.: 4.85 MIL/uL (ref 4.22–5.81)
Retic Count, Absolute: 120.8 10*3/uL (ref 19.0–186.0)
Retic Ct Pct: 2.5 % (ref 0.4–3.1)

## 2022-01-18 LAB — POCT I-STAT EG7
Acid-Base Excess: 2 mmol/L (ref 0.0–2.0)
Bicarbonate: 26.9 mmol/L (ref 20.0–28.0)
Calcium, Ion: 0.99 mmol/L — ABNORMAL LOW (ref 1.15–1.40)
HCT: 28 % — ABNORMAL LOW (ref 39.0–52.0)
Hemoglobin: 9.5 g/dL — ABNORMAL LOW (ref 13.0–17.0)
O2 Saturation: 84 %
Potassium: 4.8 mmol/L (ref 3.5–5.1)
Sodium: 138 mmol/L (ref 135–145)
TCO2: 28 mmol/L (ref 22–32)
pCO2, Ven: 45.1 mmHg (ref 44–60)
pH, Ven: 7.384 (ref 7.25–7.43)
pO2, Ven: 49 mmHg — ABNORMAL HIGH (ref 32–45)

## 2022-01-18 LAB — APTT: aPTT: 31 seconds (ref 24–36)

## 2022-01-18 LAB — HEPARIN LEVEL (UNFRACTIONATED): Heparin Unfractionated: 0.66 IU/mL (ref 0.30–0.70)

## 2022-01-18 LAB — FERRITIN: Ferritin: 7 ng/mL — ABNORMAL LOW (ref 24–336)

## 2022-01-18 LAB — IRON AND TIBC
Iron: 85 ug/dL (ref 45–182)
Saturation Ratios: 25 % (ref 17.9–39.5)
TIBC: 339 ug/dL (ref 250–450)
UIBC: 254 ug/dL

## 2022-01-18 LAB — FOLATE: Folate: 14.2 ng/mL (ref >5.9)

## 2022-01-18 LAB — PLATELET COUNT: Platelets: 139 10*3/uL — ABNORMAL LOW (ref 150–400)

## 2022-01-18 LAB — PHOSPHORUS: Phosphorus: 4.3 mg/dL (ref 2.5–4.6)

## 2022-01-18 LAB — VITAMIN B12: Vitamin B-12: 734 pg/mL (ref 180–914)

## 2022-01-18 LAB — PREPARE RBC (CROSSMATCH)

## 2022-01-18 SURGERY — CORONARY ARTERY BYPASS GRAFTING (CABG)
Anesthesia: General | Site: Chest

## 2022-01-18 MED ORDER — AMIODARONE HCL IN DEXTROSE 360-4.14 MG/200ML-% IV SOLN
30.0000 mg/h | INTRAVENOUS | Status: DC
  Administered 2022-01-18 – 2022-01-19 (×2): 30 mg/h via INTRAVENOUS
  Filled 2022-01-18 (×2): qty 200

## 2022-01-18 MED ORDER — DEXMEDETOMIDINE HCL IN NACL 400 MCG/100ML IV SOLN
0.0000 ug/kg/h | INTRAVENOUS | Status: DC
  Administered 2022-01-18: 0.7 ug/kg/h via INTRAVENOUS

## 2022-01-18 MED ORDER — EPHEDRINE SULFATE-NACL 50-0.9 MG/10ML-% IV SOSY
PREFILLED_SYRINGE | INTRAVENOUS | Status: DC | PRN
Start: 2022-01-18 — End: 2022-01-18
  Administered 2022-01-18 (×2): 5 mg via INTRAVENOUS

## 2022-01-18 MED ORDER — MIDAZOLAM HCL (PF) 10 MG/2ML IJ SOLN
INTRAMUSCULAR | Status: AC
  Filled 2022-01-18: qty 2

## 2022-01-18 MED ORDER — FENTANYL CITRATE (PF) 250 MCG/5ML IJ SOLN
INTRAMUSCULAR | Status: AC
  Filled 2022-01-18: qty 5

## 2022-01-18 MED ORDER — MIDAZOLAM HCL 2 MG/2ML IJ SOLN
2.0000 mg | INTRAMUSCULAR | Status: DC | PRN
  Administered 2022-01-18: 1 mg via INTRAVENOUS
  Administered 2022-01-18: 2 mg via INTRAVENOUS
  Filled 2022-01-18 (×2): qty 2

## 2022-01-18 MED ORDER — SODIUM CHLORIDE 0.45 % IV SOLN: INTRAVENOUS | Status: DC | PRN

## 2022-01-18 MED ORDER — FAMOTIDINE IN NACL 20-0.9 MG/50ML-% IV SOLN
20.0000 mg | Freq: Two times a day (BID) | INTRAVENOUS | Status: AC
  Administered 2022-01-18: 20 mg via INTRAVENOUS
  Filled 2022-01-18: qty 50

## 2022-01-18 MED ORDER — ROCURONIUM BROMIDE 10 MG/ML (PF) SYRINGE
PREFILLED_SYRINGE | INTRAVENOUS | Status: DC | PRN
Start: 2022-01-18 — End: 2022-01-18
  Administered 2022-01-18: 50 mg via INTRAVENOUS
  Administered 2022-01-18: 30 mg via INTRAVENOUS
  Administered 2022-01-18 (×2): 50 mg via INTRAVENOUS
  Administered 2022-01-18: 20 mg via INTRAVENOUS
  Administered 2022-01-18: 80 mg via INTRAVENOUS
  Administered 2022-01-18 (×2): 50 mg via INTRAVENOUS

## 2022-01-18 MED ORDER — CHLORHEXIDINE GLUCONATE 0.12 % MT SOLN: 15.0000 mL | Freq: Once | OROMUCOSAL | Status: DC

## 2022-01-18 MED ORDER — SURGIFLO WITH THROMBIN (HEMOSTATIC MATRIX KIT) OPTIME
TOPICAL | Status: DC | PRN
  Administered 2022-01-18: 1 via TOPICAL

## 2022-01-18 MED ORDER — PROTAMINE SULFATE 10 MG/ML IV SOLN
INTRAVENOUS | Status: AC
  Filled 2022-01-18: qty 5

## 2022-01-18 MED ORDER — LACTATED RINGERS IV SOLN: INTRAVENOUS | Status: DC

## 2022-01-18 MED ORDER — KETOROLAC TROMETHAMINE 30 MG/ML IJ SOLN
30.0000 mg | Freq: Once | INTRAMUSCULAR | Status: AC
  Administered 2022-01-18: 30 mg via INTRAVENOUS

## 2022-01-18 MED ORDER — 0.9 % SODIUM CHLORIDE (POUR BTL) OPTIME
TOPICAL | Status: DC | PRN
  Administered 2022-01-18: 5000 mL

## 2022-01-18 MED ORDER — LACTATED RINGERS IV SOLN: INTRAVENOUS | Status: DC | PRN

## 2022-01-18 MED ORDER — ASPIRIN EC 325 MG PO TBEC
325.0000 mg | DELAYED_RELEASE_TABLET | Freq: Every day | ORAL | Status: DC
  Administered 2022-01-19 – 2022-01-21 (×3): 325 mg via ORAL
  Filled 2022-01-18 (×3): qty 1

## 2022-01-18 MED ORDER — ACETAMINOPHEN 500 MG PO TABS
1000.0000 mg | ORAL_TABLET | Freq: Four times a day (QID) | ORAL | Status: DC
  Administered 2022-01-18 – 2022-01-21 (×10): 1000 mg via ORAL
  Filled 2022-01-18 (×10): qty 2

## 2022-01-18 MED ORDER — PROTAMINE SULFATE 10 MG/ML IV SOLN
INTRAVENOUS | Status: AC
  Filled 2022-01-18: qty 25

## 2022-01-18 MED ORDER — ONDANSETRON HCL 4 MG/2ML IJ SOLN
4.0000 mg | Freq: Four times a day (QID) | INTRAMUSCULAR | Status: DC | PRN
  Administered 2022-01-18 – 2022-01-21 (×5): 4 mg via INTRAVENOUS
  Filled 2022-01-18 (×5): qty 2

## 2022-01-18 MED ORDER — FENTANYL CITRATE (PF) 250 MCG/5ML IJ SOLN
INTRAMUSCULAR | Status: DC | PRN
  Administered 2022-01-18 (×2): 100 ug via INTRAVENOUS
  Administered 2022-01-18: 150 ug via INTRAVENOUS
  Administered 2022-01-18 (×2): 50 ug via INTRAVENOUS
  Administered 2022-01-18: 100 ug via INTRAVENOUS
  Administered 2022-01-18: 50 ug via INTRAVENOUS
  Administered 2022-01-18: 100 ug via INTRAVENOUS
  Administered 2022-01-18: 50 ug via INTRAVENOUS
  Administered 2022-01-18: 500 ug via INTRAVENOUS
  Administered 2022-01-18 (×2): 100 ug via INTRAVENOUS
  Administered 2022-01-18: 50 ug via INTRAVENOUS

## 2022-01-18 MED ORDER — SODIUM CHLORIDE 0.9 % IV SOLN: INTRAVENOUS | Status: DC

## 2022-01-18 MED ORDER — ACETAMINOPHEN 160 MG/5ML PO SOLN: 650.0000 mg | Freq: Once | ORAL | Status: AC

## 2022-01-18 MED ORDER — ROCURONIUM BROMIDE 10 MG/ML (PF) SYRINGE
PREFILLED_SYRINGE | INTRAVENOUS | Status: AC
  Filled 2022-01-18: qty 20

## 2022-01-18 MED ORDER — SODIUM CHLORIDE 0.9% FLUSH: 3.0000 mL | INTRAVENOUS | Status: DC | PRN

## 2022-01-18 MED ORDER — HEPARIN SODIUM (PORCINE) 1000 UNIT/ML IJ SOLN
INTRAMUSCULAR | Status: DC | PRN
Start: 2022-01-18 — End: 2022-01-18
  Administered 2022-01-18: 5000 [IU] via INTRAVENOUS
  Administered 2022-01-18: 3000 [IU] via INTRAVENOUS
  Administered 2022-01-18: 27000 [IU] via INTRAVENOUS

## 2022-01-18 MED ORDER — ACETAMINOPHEN 160 MG/5ML PO SOLN: 1000.0000 mg | Freq: Four times a day (QID) | ORAL | Status: DC

## 2022-01-18 MED ORDER — HEPARIN SODIUM (PORCINE) 1000 UNIT/ML IJ SOLN
INTRAMUSCULAR | Status: AC
  Filled 2022-01-18: qty 1

## 2022-01-18 MED ORDER — SODIUM CHLORIDE 0.9% IV SOLUTION: Freq: Once | INTRAVENOUS | Status: DC

## 2022-01-18 MED ORDER — DEXMEDETOMIDINE HCL IN NACL 400 MCG/100ML IV SOLN
INTRAVENOUS | Status: AC
  Filled 2022-01-18: qty 100

## 2022-01-18 MED ORDER — PHENYLEPHRINE 40 MCG/ML (10ML) SYRINGE FOR IV PUSH (FOR BLOOD PRESSURE SUPPORT)
PREFILLED_SYRINGE | INTRAVENOUS | Status: DC | PRN
  Administered 2022-01-18: 20 ug via INTRAVENOUS
  Administered 2022-01-18: 40 ug via INTRAVENOUS

## 2022-01-18 MED ORDER — CHLORHEXIDINE GLUCONATE CLOTH 2 % EX PADS
6.0000 | MEDICATED_PAD | Freq: Every day | CUTANEOUS | Status: DC
  Administered 2022-01-18 – 2022-01-20 (×3): 6 via TOPICAL

## 2022-01-18 MED ORDER — SODIUM CHLORIDE 0.9 % IV SOLN: 250.0000 mL | INTRAVENOUS | Status: DC

## 2022-01-18 MED ORDER — PHENYLEPHRINE 40 MCG/ML (10ML) SYRINGE FOR IV PUSH (FOR BLOOD PRESSURE SUPPORT)
PREFILLED_SYRINGE | INTRAVENOUS | Status: AC
  Filled 2022-01-18: qty 10

## 2022-01-18 MED ORDER — SODIUM CHLORIDE (PF) 0.9 % IJ SOLN
OROMUCOSAL | Status: DC | PRN
  Administered 2022-01-18: 16 mL via TOPICAL

## 2022-01-18 MED ORDER — VANCOMYCIN HCL IN DEXTROSE 1-5 GM/200ML-% IV SOLN
1000.0000 mg | Freq: Two times a day (BID) | INTRAVENOUS | Status: AC
  Administered 2022-01-18 – 2022-01-19 (×2): 1000 mg via INTRAVENOUS
  Filled 2022-01-18 (×2): qty 200

## 2022-01-18 MED ORDER — OXYCODONE HCL 5 MG PO TABS
5.0000 mg | ORAL_TABLET | ORAL | Status: DC | PRN
  Administered 2022-01-19 – 2022-01-20 (×2): 10 mg via ORAL
  Administered 2022-01-20: 5 mg via ORAL
  Administered 2022-01-20: 10 mg via ORAL
  Administered 2022-01-20: 5 mg via ORAL
  Administered 2022-01-21: 10 mg via ORAL
  Filled 2022-01-18: qty 1
  Filled 2022-01-18 (×4): qty 2
  Filled 2022-01-18: qty 1

## 2022-01-18 MED ORDER — NITROGLYCERIN IN D5W 200-5 MCG/ML-% IV SOLN: 0.0000 ug/min | INTRAVENOUS | Status: DC

## 2022-01-18 MED ORDER — MIDAZOLAM HCL (PF) 5 MG/ML IJ SOLN
INTRAMUSCULAR | Status: DC | PRN
  Administered 2022-01-18 (×3): 1 mg via INTRAVENOUS
  Administered 2022-01-18 (×2): 2 mg via INTRAVENOUS
  Administered 2022-01-18: 1 mg via INTRAVENOUS
  Administered 2022-01-18: 2 mg via INTRAVENOUS

## 2022-01-18 MED ORDER — BISACODYL 5 MG PO TBEC
10.0000 mg | DELAYED_RELEASE_TABLET | Freq: Every day | ORAL | Status: DC
  Administered 2022-01-19 – 2022-01-21 (×3): 10 mg via ORAL
  Filled 2022-01-18 (×3): qty 2

## 2022-01-18 MED ORDER — SODIUM CHLORIDE 0.9% FLUSH
3.0000 mL | Freq: Two times a day (BID) | INTRAVENOUS | Status: DC
  Administered 2022-01-19 – 2022-01-21 (×5): 3 mL via INTRAVENOUS

## 2022-01-18 MED ORDER — POTASSIUM CHLORIDE 10 MEQ/50ML IV SOLN: 10.0000 meq | INTRAVENOUS | Status: AC

## 2022-01-18 MED ORDER — ASPIRIN 81 MG PO CHEW: 324.0000 mg | CHEWABLE_TABLET | Freq: Every day | ORAL | Status: DC

## 2022-01-18 MED ORDER — DEXTROSE 50 % IV SOLN: 0.0000 mL | INTRAVENOUS | Status: DC | PRN

## 2022-01-18 MED ORDER — LACTATED RINGERS IV SOLN: 500.0000 mL | Freq: Once | INTRAVENOUS | Status: DC | PRN

## 2022-01-18 MED ORDER — PROPOFOL 10 MG/ML IV BOLUS
INTRAVENOUS | Status: AC
Start: 2022-01-18 — End: ?
  Filled 2022-01-18: qty 20

## 2022-01-18 MED ORDER — ORAL CARE MOUTH RINSE: 15.0000 mL | Freq: Once | OROMUCOSAL | Status: DC

## 2022-01-18 MED ORDER — INSULIN REGULAR(HUMAN) IN NACL 100-0.9 UT/100ML-% IV SOLN: INTRAVENOUS | Status: DC

## 2022-01-18 MED ORDER — METOPROLOL TARTRATE 25 MG/10 ML ORAL SUSPENSION: 12.5000 mg | Freq: Two times a day (BID) | ORAL | Status: DC

## 2022-01-18 MED ORDER — MORPHINE SULFATE (PF) 2 MG/ML IV SOLN
1.0000 mg | INTRAVENOUS | Status: DC | PRN
  Administered 2022-01-18 – 2022-01-19 (×2): 2 mg via INTRAVENOUS
  Filled 2022-01-18 (×3): qty 1

## 2022-01-18 MED ORDER — NITROGLYCERIN IN D5W 200-5 MCG/ML-% IV SOLN
INTRAVENOUS | Status: AC
  Filled 2022-01-18: qty 250

## 2022-01-18 MED ORDER — ACETAMINOPHEN 650 MG RE SUPP
650.0000 mg | Freq: Once | RECTAL | Status: AC
  Administered 2022-01-18: 650 mg via RECTAL
  Filled 2022-01-18: qty 1

## 2022-01-18 MED ORDER — ROCURONIUM BROMIDE 10 MG/ML (PF) SYRINGE
PREFILLED_SYRINGE | INTRAVENOUS | Status: AC
Start: 2022-01-18 — End: ?
  Filled 2022-01-18: qty 10

## 2022-01-18 MED ORDER — ALBUMIN HUMAN 5 % IV SOLN
INTRAVENOUS | Status: DC | PRN
Start: 2022-01-18 — End: 2022-01-18

## 2022-01-18 MED ORDER — HEPARIN SODIUM (PORCINE) 1000 UNIT/ML IJ SOLN
INTRAMUSCULAR | Status: AC
  Filled 2022-01-18: qty 10

## 2022-01-18 MED ORDER — KETOROLAC TROMETHAMINE 30 MG/ML IJ SOLN
INTRAMUSCULAR | Status: AC
  Filled 2022-01-18: qty 1

## 2022-01-18 MED ORDER — METOPROLOL TARTRATE 12.5 MG HALF TABLET
12.5000 mg | ORAL_TABLET | Freq: Two times a day (BID) | ORAL | Status: DC
  Administered 2022-01-19 – 2022-01-20 (×3): 12.5 mg via ORAL
  Filled 2022-01-18 (×4): qty 1

## 2022-01-18 MED ORDER — PANTOPRAZOLE SODIUM 40 MG PO TBEC
40.0000 mg | DELAYED_RELEASE_TABLET | Freq: Every day | ORAL | Status: DC
  Administered 2022-01-20 – 2022-01-21 (×2): 40 mg via ORAL
  Filled 2022-01-18 (×2): qty 1

## 2022-01-18 MED ORDER — CEFAZOLIN SODIUM-DEXTROSE 2-4 GM/100ML-% IV SOLN
2.0000 g | Freq: Three times a day (TID) | INTRAVENOUS | Status: AC
  Administered 2022-01-18 – 2022-01-20 (×6): 2 g via INTRAVENOUS
  Filled 2022-01-18 (×6): qty 100

## 2022-01-18 MED ORDER — PROPOFOL 10 MG/ML IV BOLUS
INTRAVENOUS | Status: DC | PRN
  Administered 2022-01-18: 20 mg via INTRAVENOUS

## 2022-01-18 MED ORDER — MILRINONE LACTATE IN DEXTROSE 20-5 MG/100ML-% IV SOLN
0.1250 ug/kg/min | INTRAVENOUS | Status: DC
  Administered 2022-01-19: 0.125 ug/kg/min via INTRAVENOUS
  Administered 2022-01-19: 0.25 ug/kg/min via INTRAVENOUS
  Filled 2022-01-18 (×3): qty 100

## 2022-01-18 MED ORDER — PLASMA-LYTE A IV SOLN
INTRAVENOUS | Status: DC | PRN
  Administered 2022-01-18: 500 mL via INTRAVASCULAR

## 2022-01-18 MED ORDER — CHLORHEXIDINE GLUCONATE 0.12 % MT SOLN
15.0000 mL | OROMUCOSAL | Status: AC
  Administered 2022-01-18: 15 mL via OROMUCOSAL

## 2022-01-18 MED ORDER — BISACODYL 10 MG RE SUPP: 10.0000 mg | Freq: Every day | RECTAL | Status: DC

## 2022-01-18 MED ORDER — ALBUMIN HUMAN 5 % IV SOLN
250.0000 mL | INTRAVENOUS | Status: AC | PRN
  Administered 2022-01-18: 12.5 g via INTRAVENOUS

## 2022-01-18 MED ORDER — PHENYLEPHRINE HCL-NACL 20-0.9 MG/250ML-% IV SOLN: 0.0000 ug/min | INTRAVENOUS | Status: DC

## 2022-01-18 MED ORDER — METOPROLOL TARTRATE 5 MG/5ML IV SOLN: 2.5000 mg | INTRAVENOUS | Status: DC | PRN

## 2022-01-18 MED ORDER — EPHEDRINE 5 MG/ML INJ
INTRAVENOUS | Status: AC
  Filled 2022-01-18: qty 5

## 2022-01-18 MED ORDER — TRAMADOL HCL 50 MG PO TABS
50.0000 mg | ORAL_TABLET | ORAL | Status: DC | PRN
  Administered 2022-01-18 – 2022-01-21 (×8): 100 mg via ORAL
  Filled 2022-01-18 (×8): qty 2

## 2022-01-18 MED ORDER — PROTAMINE SULFATE 10 MG/ML IV SOLN
INTRAVENOUS | Status: DC | PRN
  Administered 2022-01-18: 300 mg via INTRAVENOUS

## 2022-01-18 MED ORDER — HEMOSTATIC AGENTS (NO CHARGE) OPTIME
TOPICAL | Status: DC | PRN
  Administered 2022-01-18 (×2): 1 via TOPICAL

## 2022-01-18 MED ORDER — MILRINONE LACTATE IN DEXTROSE 20-5 MG/100ML-% IV SOLN: 0.2500 ug/kg/min | INTRAVENOUS | Status: DC

## 2022-01-18 MED ORDER — DOCUSATE SODIUM 100 MG PO CAPS
200.0000 mg | ORAL_CAPSULE | Freq: Every day | ORAL | Status: DC
  Administered 2022-01-19 – 2022-01-21 (×3): 200 mg via ORAL
  Filled 2022-01-18 (×3): qty 2

## 2022-01-18 MED ORDER — MAGNESIUM SULFATE 4 GM/100ML IV SOLN
4.0000 g | Freq: Once | INTRAVENOUS | Status: AC
  Administered 2022-01-18: 4 g via INTRAVENOUS
  Filled 2022-01-18: qty 100

## 2022-01-18 MED ORDER — SODIUM CHLORIDE 0.9% IV SOLUTION
Freq: Once | INTRAVENOUS | Status: AC
  Administered 2022-01-18: 200 mL via INTRAVENOUS

## 2022-01-18 MED ORDER — VANCOMYCIN HCL IN DEXTROSE 1-5 GM/200ML-% IV SOLN: 1000.0000 mg | Freq: Once | INTRAVENOUS | Status: DC

## 2022-01-18 MED ORDER — ROCURONIUM BROMIDE 10 MG/ML (PF) SYRINGE
PREFILLED_SYRINGE | INTRAVENOUS | Status: AC
  Filled 2022-01-18: qty 10

## 2022-01-18 SURGICAL SUPPLY — 99 items
ADAPTER CARDIO PERF ANTE/RETRO (ADAPTER) ×3 IMPLANT
ADPR PRFSN 84XANTGRD RTRGD (ADAPTER) ×2
AGENT HMST KT MTR STRL THRMB (HEMOSTASIS) ×2
BAG DECANTER FOR FLEXI CONT (MISCELLANEOUS) ×3 IMPLANT
BLADE CLIPPER SURG (BLADE) ×3 IMPLANT
BLADE STERNUM SYSTEM 6 (BLADE) ×3 IMPLANT
BLADE SURG 12 STRL SS (BLADE) ×3 IMPLANT
BNDG ELASTIC 4X5.8 VLCR STR LF (GAUZE/BANDAGES/DRESSINGS) ×3 IMPLANT
BNDG ELASTIC 6X5.8 VLCR STR LF (GAUZE/BANDAGES/DRESSINGS) ×3 IMPLANT
BNDG GAUZE ELAST 4 BULKY (GAUZE/BANDAGES/DRESSINGS) ×3 IMPLANT
CANISTER SUCT 3000ML PPV (MISCELLANEOUS) ×3 IMPLANT
CANNULA GUNDRY RCSP 15FR (MISCELLANEOUS) ×3 IMPLANT
CATH CPB KIT VANTRIGT (MISCELLANEOUS) ×3 IMPLANT
CATH ROBINSON RED A/P 18FR (CATHETERS) ×9 IMPLANT
CATH THORACIC 28FR RT ANG (CATHETERS) ×4 IMPLANT
CLIP FOGARTY SPRING 6M (CLIP) ×1 IMPLANT
CLIP TI WIDE RED SMALL 24 (CLIP) ×1 IMPLANT
CONN ST 1/4X3/8  BEN (MISCELLANEOUS) ×3
CONN ST 1/4X3/8 BEN (MISCELLANEOUS) IMPLANT
CONTAINER PROTECT SURGISLUSH (MISCELLANEOUS) ×6 IMPLANT
DRAIN CHANNEL 32F RND 10.7 FF (WOUND CARE) ×3 IMPLANT
DRAPE CARDIOVASCULAR INCISE (DRAPES) ×3
DRAPE SLUSH/WARMER DISC (DRAPES) ×3 IMPLANT
DRAPE SRG 135X102X78XABS (DRAPES) ×2 IMPLANT
DRSG AQUACEL AG ADV 3.5X14 (GAUZE/BANDAGES/DRESSINGS) ×3 IMPLANT
ELECT BLADE 4.0 EZ CLEAN MEGAD (MISCELLANEOUS) ×3
ELECT BLADE 6.5 EXT (BLADE) ×3 IMPLANT
ELECT CAUTERY BLADE 6.4 (BLADE) ×3 IMPLANT
ELECT REM PT RETURN 9FT ADLT (ELECTROSURGICAL) ×6
ELECTRODE BLDE 4.0 EZ CLN MEGD (MISCELLANEOUS) ×2 IMPLANT
ELECTRODE REM PT RTRN 9FT ADLT (ELECTROSURGICAL) ×4 IMPLANT
FELT TEFLON 1X6 (MISCELLANEOUS) ×5 IMPLANT
GAUZE 4X4 16PLY ~~LOC~~+RFID DBL (SPONGE) ×3 IMPLANT
GAUZE SPONGE 4X4 12PLY STRL (GAUZE/BANDAGES/DRESSINGS) ×6 IMPLANT
GAUZE XEROFORM 5X9 LF (GAUZE/BANDAGES/DRESSINGS) ×1 IMPLANT
GLOVE SURG ENC MOIS LTX SZ7.5 (GLOVE) ×9 IMPLANT
GLOVE SURG MICRO LTX SZ6 (GLOVE) ×1 IMPLANT
GLOVE SURG MICRO LTX SZ6.5 (GLOVE) ×1 IMPLANT
GLOVE SURG PR MICRO ENCORE 7.5 (GLOVE) ×1 IMPLANT
GOWN STRL REUS W/ TWL LRG LVL3 (GOWN DISPOSABLE) ×8 IMPLANT
GOWN STRL REUS W/TWL LRG LVL3 (GOWN DISPOSABLE) ×21
HEMOSTAT POWDER SURGIFOAM 1G (HEMOSTASIS) ×10 IMPLANT
HEMOSTAT SURGICEL 2X14 (HEMOSTASIS) ×3 IMPLANT
INSERT FOGARTY XLG (MISCELLANEOUS) IMPLANT
KIT BASIN OR (CUSTOM PROCEDURE TRAY) ×3 IMPLANT
KIT SUCTION CATH 14FR (SUCTIONS) ×3 IMPLANT
KIT TURNOVER KIT B (KITS) ×3 IMPLANT
KIT VASOVIEW HEMOPRO 2 VH 4000 (KITS) ×3 IMPLANT
LEAD PACING MYOCARDI (MISCELLANEOUS) ×3 IMPLANT
MARKER GRAFT CORONARY BYPASS (MISCELLANEOUS) ×9 IMPLANT
NS IRRIG 1000ML POUR BTL (IV SOLUTION) ×15 IMPLANT
PACK E OPEN HEART (SUTURE) ×3 IMPLANT
PACK OPEN HEART (CUSTOM PROCEDURE TRAY) ×3 IMPLANT
PAD ARMBOARD 7.5X6 YLW CONV (MISCELLANEOUS) ×6 IMPLANT
PAD ELECT DEFIB RADIOL ZOLL (MISCELLANEOUS) ×3 IMPLANT
PENCIL BUTTON HOLSTER BLD 10FT (ELECTRODE) ×3 IMPLANT
POSITIONER HEAD DONUT 9IN (MISCELLANEOUS) ×3 IMPLANT
POWDER SURGICEL 3.0 GRAM (HEMOSTASIS) ×3 IMPLANT
PUNCH AORTIC ROTATE 4.0MM (MISCELLANEOUS) IMPLANT
PUNCH AORTIC ROTATE 4.5MM 8IN (MISCELLANEOUS) ×1 IMPLANT
PUNCH AORTIC ROTATE 5MM 8IN (MISCELLANEOUS) IMPLANT
SET MPS 3-ND DEL (MISCELLANEOUS) ×1 IMPLANT
SPONGE T-LAP 18X18 ~~LOC~~+RFID (SPONGE) ×14 IMPLANT
SPONGE T-LAP 4X18 ~~LOC~~+RFID (SPONGE) ×5 IMPLANT
SUPPORT HEART JANKE-BARRON (MISCELLANEOUS) ×3 IMPLANT
SURGIFLO W/THROMBIN 8M KIT (HEMOSTASIS) ×3 IMPLANT
SUT BONE WAX W31G (SUTURE) ×3 IMPLANT
SUT PROLENE 3 0 SH DA (SUTURE) IMPLANT
SUT PROLENE 3 0 SH1 36 (SUTURE) IMPLANT
SUT PROLENE 4 0 RB 1 (SUTURE) ×3
SUT PROLENE 4 0 SH DA (SUTURE) ×3 IMPLANT
SUT PROLENE 4-0 RB1 .5 CRCL 36 (SUTURE) ×2 IMPLANT
SUT PROLENE 5 0 C 1 36 (SUTURE) ×3 IMPLANT
SUT PROLENE 6 0 C 1 30 (SUTURE) ×3 IMPLANT
SUT PROLENE 6 0 CC (SUTURE) ×11 IMPLANT
SUT PROLENE 8 0 BV175 6 (SUTURE) ×2 IMPLANT
SUT PROLENE BLUE 7 0 (SUTURE) ×4 IMPLANT
SUT PROLENE POLY MONO (SUTURE) ×4 IMPLANT
SUT SILK  1 MH (SUTURE)
SUT SILK 1 MH (SUTURE) IMPLANT
SUT SILK 2 0 SH CR/8 (SUTURE) ×1 IMPLANT
SUT SILK 3 0 SH CR/8 (SUTURE) IMPLANT
SUT STEEL 6MS V (SUTURE) ×6 IMPLANT
SUT STEEL SZ 6 DBL 3X14 BALL (SUTURE) ×3 IMPLANT
SUT VIC AB 1 CTX 36 (SUTURE) ×9
SUT VIC AB 1 CTX36XBRD ANBCTR (SUTURE) ×4 IMPLANT
SUT VIC AB 2-0 CT1 27 (SUTURE) ×3
SUT VIC AB 2-0 CT1 TAPERPNT 27 (SUTURE) IMPLANT
SUT VIC AB 2-0 CTX 27 (SUTURE) IMPLANT
SUT VIC AB 3-0 X1 27 (SUTURE) ×1 IMPLANT
SYSTEM SAHARA CHEST DRAIN ATS (WOUND CARE) ×3 IMPLANT
TAPE CLOTH SURG 4X10 WHT LF (GAUZE/BANDAGES/DRESSINGS) ×1 IMPLANT
TAPE PAPER 2X10 WHT MICROPORE (GAUZE/BANDAGES/DRESSINGS) ×1 IMPLANT
TOWEL GREEN STERILE (TOWEL DISPOSABLE) ×3 IMPLANT
TOWEL GREEN STERILE FF (TOWEL DISPOSABLE) ×3 IMPLANT
TRAY FOLEY SLVR 16FR TEMP STAT (SET/KITS/TRAYS/PACK) ×3 IMPLANT
TUBING LAP HI FLOW INSUFFLATIO (TUBING) ×3 IMPLANT
UNDERPAD 30X36 HEAVY ABSORB (UNDERPADS AND DIAPERS) ×3 IMPLANT
WATER STERILE IRR 1000ML POUR (IV SOLUTION) ×6 IMPLANT

## 2022-01-18 NOTE — Progress Notes (Signed)
Patient ID: Jeremy Winters, male   DOB: 1966-09-25, 56 y.o.   MRN: [id number] ? ?TCTS Evening Rounds:  ? ?Hemodynamically stable  ?CI = 2.28 on milrinone 0.25 ? ?Extubated and awake ? ?Urine output good  ?CT output low ? ?CBC ?   ?Component Value Date/Time  ? WBC 16.2 (H) 01/18/2022 1553  ? RBC 3.03 (L) 01/18/2022 1553  ? HGB 9.5 (L) 01/18/2022 1553  ? HCT 26.1 (L) 01/18/2022 1553  ? PLT 127 (L) 01/18/2022 1553  ? MCV 86.1 01/18/2022 1553  ? MCH 31.4 01/18/2022 1553  ? MCHC 36.4 (H) 01/18/2022 1553  ? RDW 12.9 01/18/2022 1553  ? LYMPHSABS 2.8 01/18/2022 0542  ? MONOABS 1.1 (H) 01/18/2022 0542  ? EOSABS 0.6 (H) 01/18/2022 0542  ? BASOSABS 0.2 (H) 01/18/2022 0542  ? ? ? ?BMET ?   ?Component Value Date/Time  ? NA 140 01/18/2022 1425  ? K 3.9 01/18/2022 1425  ? CL 100 01/18/2022 1420  ? CO2 26 01/18/2022 0542  ? GLUCOSE 117 (H) 01/18/2022 1420  ? BUN 11 01/18/2022 1420  ? CREATININE 1.00 01/18/2022 1420  ? CALCIUM 9.0 01/18/2022 0542  ? GFRNONAA >60 01/18/2022 0542  ? ? ? ?A/P:  Stable postop course. Continue current plans ? ?

## 2022-01-18 NOTE — Anesthesia Postprocedure Evaluation (Signed)
Anesthesia Post Note ? ?Patient: Javiel Canepa ? ?Procedure(s) Performed: CORONARY ARTERY BYPASS GRAFTING (CABG), ON PUMP, TIMES FOUR, USING LEFT INTERNAL MAMMARY ARTERY AND RIGHT ENDOSCOPICALLY HARVESTED GREATER SAPHENOUS VEIN (Chest) ?TRANSESOPHAGEAL ECHOCARDIOGRAM (TEE) ? ?  ? ?Patient location during evaluation: SICU ?Anesthesia Type: General ?Level of consciousness: awake and alert, patient cooperative and oriented ?Pain management: pain level controlled ?Vital Signs Assessment: post-procedure vital signs reviewed and stable ?Respiratory status: spontaneous breathing, nonlabored ventilation, respiratory function stable and patient connected to nasal cannula oxygen (recently extubated) ?Cardiovascular status: stable (remains on milrinone) ?Postop Assessment: no apparent nausea or vomiting ?Anesthetic complications: no ? ? ?No notable events documented. ? ?Last Vitals:  ?Vitals:  ? 01/18/22 1825 01/18/22 1844  ?BP:    ?Pulse: 89   ?Resp: (!) 45   ?Temp: 37.6 ?C   ?SpO2: 96% 98%  ?  ?Last Pain:  ?Vitals:  ? 01/18/22 0530  ?TempSrc: Oral  ?PainSc:   ? ? ?  ?  ?  ?  ?  ?  ? ?Breane Grunwald,E. Arwen Haseley ? ? ? ? ?

## 2022-01-18 NOTE — Anesthesia Procedure Notes (Addendum)
Procedure Name: Intubation ?Date/Time: 01/18/2022 8:46 AM ?Performed by: Inda Coke, CRNA ?Pre-anesthesia Checklist: Patient identified, Emergency Drugs available, Suction available and Patient being monitored ?Patient Re-evaluated:Patient Re-evaluated prior to induction ?Oxygen Delivery Method: Circle System Utilized ?Preoxygenation: Pre-oxygenation with 100% oxygen ?Induction Type: IV induction ?Ventilation: Mask ventilation without difficulty and Oral airway inserted - appropriate to patient size ?Laryngoscope Size: Mac and 4 ?Grade View: Grade I ?Tube type: Oral ?Tube size: 8.0 mm ?Number of attempts: 1 ?Airway Equipment and Method: Stylet and Oral airway ?Placement Confirmation: ETT inserted through vocal cords under direct vision, positive ETCO2 and breath sounds checked- equal and bilateral ?Secured at: 23 cm ?Tube secured with: Tape ?Dental Injury: Teeth and Oropharynx as per pre-operative assessment  ?Comments: Placed by Lucita Ferrara, SRNA under supervision of MDA and CRNA ? ? ? ? ?

## 2022-01-18 NOTE — Procedures (Signed)
Extubation Procedure Note ? ?Patient Details:   ?Name: Jeremy Winters ?DOB: [birth year] ?MRN: [id number] ?  ?Airway Documentation:  ?  ?Vent end date: 01/18/22 Vent end time: 1844  ? ?Evaluation ? O2 sats: stable throughout ?Complications: No apparent complications ?Patient did tolerate procedure well. ?Bilateral Breath Sounds: Clear, Diminished ?  ?Yes ? ?Patient extubated per rapid wean protocol. Positive cuff leak. NIF -35, VC 1.4L. Vitals are stable. RN at bedside. ? Jeremy Winters ?01/18/2022, 6:44 PM ? ?

## 2022-01-18 NOTE — Progress Notes (Addendum)
Pre Procedure note for inpatients: ?  ?Wilkins Elpers has been scheduled for Coronary Bypass Surgery today. The various methods of treatment have been discussed with the patient. After consideration of the risks, benefits and treatment options the patient has consented to the planned procedure.  ? ?The patient has been seen and labs reviewed. There are no changes in the patient?s condition to prevent proceeding with the planned procedure today. ? ?Recent labs: ? ?Lab Results  ?Component Value Date  ? WBC 11.6 (H) 01/18/2022  ? HGB 15.1 01/18/2022  ? HCT 41.3 01/18/2022  ? PLT 194 01/18/2022  ? GLUCOSE 106 (H) 01/18/2022  ? CHOL 192 01/13/2022  ? TRIG 286 (H) 01/13/2022  ? HDL 38 (L) 01/13/2022  ? LDLCALC 100 (H) 01/13/2022  ? ALT 133 (H) 01/18/2022  ? AST 79 (H) 01/18/2022  ? NA 139 01/18/2022  ? K 4.3 01/18/2022  ? CL 104 01/18/2022  ? CREATININE 1.32 (H) 01/18/2022  ? BUN 13 01/18/2022  ? CO2 26 01/18/2022  ? TSH 6.308 (H) 01/17/2022  ? INR 1.0 01/18/2022  ? HGBA1C 5.2 01/12/2022  ? ? ?Lovett Sox, MD ?01/18/2022 7:35 AM ? ? ?   ?

## 2022-01-18 NOTE — Transfer of Care (Addendum)
Immediate Anesthesia Transfer of Care Note ? ?Patient: Jeremy Winters ? ?Procedure(s) Performed: CORONARY ARTERY BYPASS GRAFTING (CABG), ON PUMP, TIMES FOUR, USING LEFT INTERNAL MAMMARY ARTERY AND RIGHT ENDOSCOPICALLY HARVESTED GREATER SAPHENOUS VEIN (Chest) ?TRANSESOPHAGEAL ECHOCARDIOGRAM (TEE) ? ?Patient Location: 2H ICU ? ?Anesthesia Type:General ? ?Level of Consciousness: Patient remains intubated per anesthesia plan ? ?Airway & Oxygen Therapy: Patient remains intubated per anesthesia plan and Patient placed on Ventilator (see vital sign flow sheet for setting) ? ?Post-op Assessment: Report given to RN and Post -op Vital signs reviewed and stable ? ?Post vital signs: Reviewed and stable ? ?Last Vitals:  ?Vitals Value Taken Time  ?BP    ?Temp 36.6 ?C 01/18/22 1555  ?Pulse 89 01/18/22 1555  ?Resp 18 01/18/22 1555  ?SpO2 94 % 01/18/22 1555  ?Vitals shown include unvalidated device data. ? ?Last Pain:  ?Vitals:  ? 01/18/22 0530  ?TempSrc: Oral  ?PainSc:   ?   ? ?Patients Stated Pain Goal: 0 (01/14/22 0800) ? ?Complications: No notable events documented. ?

## 2022-01-18 NOTE — Progress Notes (Signed)
Patient transferred to OR waiting room for surgery. Patient alert and oriented. Wife at bedside. ?

## 2022-01-18 NOTE — Anesthesia Procedure Notes (Signed)
Central Venous Catheter Insertion ?Performed by: Annye Asa, MD, anesthesiologist ?Start/End3/11/2021 7:46 AM, 01/18/2022 7:58 AM ?Patient location: OR. ?Preanesthetic checklist: patient identified, IV checked, site marked, risks and benefits discussed, surgical consent, monitors and equipment checked, pre-op evaluation, timeout performed and anesthesia consent ?Position: supine ?Lidocaine 1% used for infiltration and patient sedated ?Hand hygiene performed , maximum sterile barriers used  and Seldinger technique used ?Catheter size: 8.5 Fr ?PA cath was placed.Sheath introducer ?Swan type:thermodilution ?Procedure performed using ultrasound guided technique. ?Ultrasound Notes:anatomy identified, needle tip was noted to be adjacent to the nerve/plexus identified, no ultrasound evidence of intravascular and/or intraneural injection and image(s) printed for medical record ?Attempts: 1 ?Following insertion, line sutured, dressing applied and Biopatch. ?Post procedure assessment: blood return through all ports, free fluid flow and no air ? ?Patient tolerated the procedure well with no immediate complications. ?Additional procedure comments: PA catheter:  Routine monitors. Timeout, sterile prep, drape, FBP R neck.  Supine position.  1% Lido local, finder and trocar RIJ 1st pass with US guidance.  Cordis placed over J wire. PA catheter in easily.  Sterile dressing applied.  Patient tolerated well, VSS.  Jenita Seashore, MD . ? ? ? ? ? ?
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
## 2022-01-18 NOTE — Anesthesia Procedure Notes (Addendum)
Arterial Line Insertion ?Start/End3/11/2021 8:10 AM, 01/18/2022 8:16 AM ?Performed by: Epifanio Lesches, CRNA, CRNA ? Patient location: Pre-op. ?Preanesthetic checklist: patient identified, IV checked, site marked, risks and benefits discussed, surgical consent, monitors and equipment checked, pre-op evaluation, timeout performed and anesthesia consent ?Lidocaine 1% used for infiltration and patient sedated ?Left, radial was placed ?Catheter size: 20 G ?Hand hygiene performed  and maximum sterile barriers used  ?Allen's test indicative of satisfactory collateral circulation ?Attempts: 2 ?Procedure performed without using ultrasound guided technique. ?Following insertion, dressing applied and Biopatch. ?Post procedure assessment: normal ? ?Patient tolerated the procedure well with no immediate complications. ? ? ?

## 2022-01-18 NOTE — Progress Notes (Signed)
?  Echocardiogram ?Echocardiogram Transesophageal has been performed. ? ?Jeremy Winters ?01/18/2022, 10:22 AM ?

## 2022-01-18 NOTE — Brief Op Note (Signed)
01/12/2022 - 01/18/2022 ? ?1:48 PM ? ?PATIENT:  Jeremy Winters  56 y.o. male ? ?PRE-OPERATIVE DIAGNOSIS:  Coronary Artery Disease ? ?POST-OPERATIVE DIAGNOSIS:  Coronary Artery Disease ? ?PROCEDURE:  ?CORONARY ARTERY BYPASS GRAFTING (CABG), ON PUMP, TIMES , USING LEFT INTERNAL MAMMARY ARTERY AND RIGHT ENDOSCOPICALLY HARVESTED GREATER SAPHENOUS VEIN  ? ?LIMA-LAD ?SVG-OM1 ?SVG-OM2 ?SVG-PDA ? ?Vein harvest time 59min  Vein prep time 14min ? ?TRANSESOPHAGEAL ECHOCARDIOGRAM (TEE) (N/A) ? ?SURGEON:   Dahlia Byes, MD - Primary ? ?PHYSICIAN ASSISTANT: Roselind Klus ? ?ASSISTANTS: Gaylyn Cheers, RN, RN First Assistant  ? ?ANESTHESIA:   general ? ?BLOOD ADMINISTERED:none ? ?DRAINS:  Mediastinal and left pleural drains   ? ?LOCAL MEDICATIONS USED:  NONE ? ?SPECIMEN:  No Specimen ? ?DISPOSITION OF SPECIMEN:  N/A ? ?COUNTS:  YES ? ?DICTATION: .Dragon Dictation ? ?PLAN OF CARE: Admit to inpatient  ? ?PATIENT DISPOSITION:  ICU - intubated and hemodynamically stable. ?  ?Delay start of Pharmacological VTE agent (>24hrs) due to surgical blood loss or risk of bleeding: yes ? ?

## 2022-01-19 ENCOUNTER — Encounter (HOSPITAL_COMMUNITY): Payer: Self-pay | Admitting: Cardiothoracic Surgery

## 2022-01-19 ENCOUNTER — Inpatient Hospital Stay (HOSPITAL_COMMUNITY): Payer: 59

## 2022-01-19 LAB — POCT I-STAT 7, (LYTES, BLD GAS, ICA,H+H)
Acid-Base Excess: 1 mmol/L (ref 0.0–2.0)
Bicarbonate: 24.7 mmol/L (ref 20.0–28.0)
Calcium, Ion: 1.02 mmol/L — ABNORMAL LOW (ref 1.15–1.40)
HCT: 22 % — ABNORMAL LOW (ref 39.0–52.0)
Hemoglobin: 7.5 g/dL — ABNORMAL LOW (ref 13.0–17.0)
O2 Saturation: 95 %
Patient temperature: 37.3
Potassium: 4.2 mmol/L (ref 3.5–5.1)
Sodium: 136 mmol/L (ref 135–145)
TCO2: 26 mmol/L (ref 22–32)
pCO2 arterial: 34.4 mmHg (ref 32–48)
pH, Arterial: 7.466 — ABNORMAL HIGH (ref 7.35–7.45)
pO2, Arterial: 72 mmHg — ABNORMAL LOW (ref 83–108)

## 2022-01-19 LAB — MAGNESIUM
Magnesium: 2.8 mg/dL — ABNORMAL HIGH (ref 1.7–2.4)
Magnesium: 2.7 mg/dL — ABNORMAL HIGH (ref 1.7–2.4)

## 2022-01-19 LAB — CBC
HCT: 23.7 % — ABNORMAL LOW (ref 39.0–52.0)
HCT: 25.2 % — ABNORMAL LOW (ref 39.0–52.0)
Hemoglobin: 8.4 g/dL — ABNORMAL LOW (ref 13.0–17.0)
Hemoglobin: 8.5 g/dL — ABNORMAL LOW (ref 13.0–17.0)
MCH: 30.7 pg (ref 26.0–34.0)
MCH: 30.1 pg (ref 26.0–34.0)
MCHC: 35.4 g/dL (ref 30.0–36.0)
MCHC: 33.7 g/dL (ref 30.0–36.0)
MCV: 86.5 fL (ref 80.0–100.0)
MCV: 89.4 fL (ref 80.0–100.0)
Platelets: 132 10*3/uL — ABNORMAL LOW (ref 150–400)
Platelets: 146 10*3/uL — ABNORMAL LOW (ref 150–400)
RBC: 2.74 MIL/uL — ABNORMAL LOW (ref 4.22–5.81)
RBC: 2.82 MIL/uL — ABNORMAL LOW (ref 4.22–5.81)
RDW: 13 % (ref 11.5–15.5)
RDW: 13.3 % (ref 11.5–15.5)
WBC: 14.7 10*3/uL — ABNORMAL HIGH (ref 4.0–10.5)
WBC: 16.1 10*3/uL — ABNORMAL HIGH (ref 4.0–10.5)
nRBC: 0 % (ref 0.0–0.2)
nRBC: 0 % (ref 0.0–0.2)

## 2022-01-19 LAB — GLUCOSE, CAPILLARY
Glucose-Capillary: 141 mg/dL — ABNORMAL HIGH (ref 70–99)
Glucose-Capillary: 147 mg/dL — ABNORMAL HIGH (ref 70–99)
Glucose-Capillary: 148 mg/dL — ABNORMAL HIGH (ref 70–99)
Glucose-Capillary: 149 mg/dL — ABNORMAL HIGH (ref 70–99)
Glucose-Capillary: 150 mg/dL — ABNORMAL HIGH (ref 70–99)
Glucose-Capillary: 159 mg/dL — ABNORMAL HIGH (ref 70–99)
Glucose-Capillary: 160 mg/dL — ABNORMAL HIGH (ref 70–99)
Glucose-Capillary: 163 mg/dL — ABNORMAL HIGH (ref 70–99)
Glucose-Capillary: 179 mg/dL — ABNORMAL HIGH (ref 70–99)
Glucose-Capillary: 182 mg/dL — ABNORMAL HIGH (ref 70–99)
Glucose-Capillary: 184 mg/dL — ABNORMAL HIGH (ref 70–99)
Glucose-Capillary: 208 mg/dL — ABNORMAL HIGH (ref 70–99)

## 2022-01-19 LAB — COOXEMETRY PANEL
Carboxyhemoglobin: <0.3 % — ABNORMAL LOW (ref 0.5–1.5)
Methemoglobin: 2.2 % — ABNORMAL HIGH (ref 0.0–1.5)
O2 Saturation: 63.5 %
Total hemoglobin: 8.3 g/dL — ABNORMAL LOW (ref 12.0–16.0)

## 2022-01-19 LAB — PREPARE FRESH FROZEN PLASMA: Unit division: 0

## 2022-01-19 LAB — BASIC METABOLIC PANEL
Anion gap: 6 (ref 5–15)
Anion gap: 8 (ref 5–15)
BUN: 12 mg/dL (ref 6–20)
BUN: 14 mg/dL (ref 6–20)
CO2: 24 mmol/L (ref 22–32)
CO2: 25 mmol/L (ref 22–32)
Calcium: 7.1 mg/dL — ABNORMAL LOW (ref 8.9–10.3)
Calcium: 7.3 mg/dL — ABNORMAL LOW (ref 8.9–10.3)
Chloride: 104 mmol/L (ref 98–111)
Chloride: 99 mmol/L (ref 98–111)
Creatinine, Ser: 1.17 mg/dL (ref 0.61–1.24)
Creatinine, Ser: 1.33 mg/dL — ABNORMAL HIGH (ref 0.61–1.24)
GFR, Estimated: >60 mL/min (ref >60)
GFR, Estimated: >60 mL/min (ref >60)
Glucose, Bld: 137 mg/dL — ABNORMAL HIGH (ref 70–99)
Glucose, Bld: 148 mg/dL — ABNORMAL HIGH (ref 70–99)
Potassium: 4.1 mmol/L (ref 3.5–5.1)
Potassium: 3.9 mmol/L (ref 3.5–5.1)
Sodium: 134 mmol/L — ABNORMAL LOW (ref 135–145)
Sodium: 132 mmol/L — ABNORMAL LOW (ref 135–145)

## 2022-01-19 LAB — BPAM FFP
Blood Product Expiration Date: 202303062359
Blood Product Expiration Date: 202303062359
ISSUE DATE / TIME: 202303011328
ISSUE DATE / TIME: 202303011328
Unit Type and Rh: 600
Unit Type and Rh: 6200

## 2022-01-19 MED ORDER — ENOXAPARIN SODIUM 40 MG/0.4ML IJ SOSY
40.0000 mg | PREFILLED_SYRINGE | Freq: Every day | INTRAMUSCULAR | Status: DC
  Administered 2022-01-19 – 2022-01-20 (×2): 40 mg via SUBCUTANEOUS
  Filled 2022-01-19 (×2): qty 0.4

## 2022-01-19 MED ORDER — AMIODARONE HCL 200 MG PO TABS
200.0000 mg | ORAL_TABLET | Freq: Two times a day (BID) | ORAL | Status: DC
  Administered 2022-01-19 – 2022-01-25 (×14): 200 mg via ORAL
  Filled 2022-01-19 (×14): qty 1

## 2022-01-19 MED ORDER — INSULIN ASPART 100 UNIT/ML IJ SOLN
0.0000 [IU] | INTRAMUSCULAR | Status: DC
  Administered 2022-01-19: 2 [IU] via SUBCUTANEOUS

## 2022-01-19 MED ORDER — FUROSEMIDE 10 MG/ML IJ SOLN: 20.0000 mg | Freq: Two times a day (BID) | INTRAMUSCULAR | Status: DC

## 2022-01-19 MED ORDER — INSULIN DETEMIR 100 UNIT/ML ~~LOC~~ SOLN: 12.0000 [IU] | Freq: Every day | SUBCUTANEOUS | Status: DC

## 2022-01-19 MED ORDER — INSULIN ASPART 100 UNIT/ML IJ SOLN
0.0000 [IU] | Freq: Three times a day (TID) | INTRAMUSCULAR | Status: DC
  Administered 2022-01-19: 2 [IU] via SUBCUTANEOUS
  Administered 2022-01-19: 4 [IU] via SUBCUTANEOUS
  Administered 2022-01-20 (×3): 2 [IU] via SUBCUTANEOUS
  Administered 2022-01-21: 4 [IU] via SUBCUTANEOUS
  Administered 2022-01-21 – 2022-01-22 (×3): 2 [IU] via SUBCUTANEOUS

## 2022-01-19 MED ORDER — ROSUVASTATIN CALCIUM 5 MG PO TABS
10.0000 mg | ORAL_TABLET | Freq: Every day | ORAL | Status: DC
Start: 2022-01-19 — End: 2022-01-19

## 2022-01-19 MED ORDER — INSULIN DETEMIR 100 UNIT/ML ~~LOC~~ SOLN
12.0000 [IU] | Freq: Every day | SUBCUTANEOUS | Status: DC
  Administered 2022-01-19 – 2022-01-22 (×4): 12 [IU] via SUBCUTANEOUS
  Filled 2022-01-19 (×5): qty 0.12

## 2022-01-19 MED ORDER — FUROSEMIDE 10 MG/ML IJ SOLN
20.0000 mg | Freq: Two times a day (BID) | INTRAMUSCULAR | Status: DC
  Administered 2022-01-19 – 2022-01-21 (×5): 20 mg via INTRAVENOUS
  Filled 2022-01-19 (×5): qty 2

## 2022-01-19 MED ORDER — INSULIN ASPART 100 UNIT/ML IJ SOLN: 0.0000 [IU] | INTRAMUSCULAR | Status: DC

## 2022-01-19 MED FILL — Potassium Chloride Inj 2 mEq/ML: INTRAVENOUS | Qty: 40 | Status: AC

## 2022-01-19 MED FILL — Magnesium Sulfate Inj 50%: INTRAMUSCULAR | Qty: 10 | Status: AC

## 2022-01-19 MED FILL — Heparin Sodium (Porcine) Inj 1000 Unit/ML: Qty: 1000 | Status: AC

## 2022-01-19 NOTE — Progress Notes (Signed)
Foley removed on dayshift, has not voided despite lasix 20 mg IV around 1800.  ?Denies urge to void or bladder fullness ? ?Following bladder scan per protocol ? 2100 = 268mL ? 2315 = 325 mL ? ?

## 2022-01-19 NOTE — Progress Notes (Addendum)
[facility] DAILY ICU PROGRESS NOTE ? ?                 [address] ?           [place] [address] ?         [phone]  ? ?1 Day Post-Op ?Procedure(s) (LRB): ?CORONARY ARTERY BYPASS GRAFTING (CABG), ON PUMP, TIMES FOUR, USING LEFT INTERNAL MAMMARY ARTERY AND RIGHT ENDOSCOPICALLY HARVESTED GREATER SAPHENOUS VEIN (N/A) ?TRANSESOPHAGEAL ECHOCARDIOGRAM (TEE) (N/A) ? ?Total Length of Stay:  LOS: 7 days  ? ?Subjective: ?Extubated around 7 PM last evening.  Awake and alert, pain control adequate.  No complaints or concerns. ? ?Neo-Synephrine is off ?Milrinone at 0.25 mics per kilogram per minute ?Cardiac index - 3 ?Co. Ox -63 ? ?Objective: ?Vital signs in last 24 hours: ?Temp:  [97.9 ?F (36.6 ?C)-99.7 ?F (37.6 ?C)] 99 ?F (37.2 ?C) (03/02 0700) ?Pulse Rate:  [50-89] 74 (03/02 0700) ?Cardiac Rhythm: Atrial paced (03/02 0400) ?Resp:  [0-45] 28 (03/02 0700) ?BP: (93-117)/(53-81) 111/62 (03/02 0700) ?SpO2:  [92 %-99 %] 95 % (03/02 0700) ?Arterial Line BP: (94-168)/(46-88) 127/58 (03/02 0700) ?FiO2 (%):  [40 %-50 %] 40 % (03/01 1757) ?Weight:  [95.9 kg] 95.9 kg (03/02 0500) ? ?Filed Weights  ? 01/17/22 0336 01/18/22 0530 01/19/22 0500  ?Weight: 89.2 kg 89 kg 95.9 kg  ? ? ?Weight change: 6.9 kg  ? ?Hemodynamic parameters for last 24 hours: ?PAP: (1-44)/(-6-24) 33/15 ?CO:  [4.3 L/min-7 L/min] 7 L/min ?CI:  [2.1 L/min/m2-3.4 L/min/m2] 3.4 L/min/m2 ? ?Intake/Output from previous day: ?03/01 0701 - 03/02 0700 ?In: 5090.1 [I.V.:3335.3; Blood:741; IV Piggyback:1013.7] ?Out: 6560 [id number]; Emesis/NG output:30; Blood:600; Chest Tube:560] ? ?Intake/Output this shift: ?No intake/output data recorded. ? ?Current Meds: ?Scheduled Meds: ? acetaminophen  1,000 mg Oral Q6H  ? aspirin EC  325 mg Oral Daily  ? atorvastatin  80 mg Oral Daily  ? bisacodyl  10 mg Oral Daily  ? Or  ? bisacodyl  10 mg Rectal Daily  ? Chlorhexidine Gluconate Cloth  6 each Topical Daily  ? docusate sodium  200 mg Oral Daily  ? metoprolol tartrate  12.5  mg Oral BID  ? [START ON 01/20/2022] pantoprazole  40 mg Oral Daily  ? sodium chloride flush  3 mL Intravenous Q12H  ? ?Continuous Infusions: ? sodium chloride Stopped (01/18/22 2139)  ? sodium chloride Stopped (01/19/22 0725)  ? sodium chloride 20 mL/hr at 01/18/22 1540  ? albumin human 12.5 g (01/18/22 1758)  ? amiodarone 30 mg/hr (01/19/22 0528)  ?  ceFAZolin (ANCEF) IV 200 mL/hr at 01/19/22 0600  ? dexmedetomidine (PRECEDEX) IV infusion Stopped (01/18/22 1853)  ? famotidine (PEPCID) IV Stopped (01/18/22 1645)  ? insulin 1.3 Units/hr (01/19/22 0600)  ? lactated ringers    ? lactated ringers Stopped (01/18/22 1545)  ? lactated ringers 20 mL/hr at 01/19/22 0600  ? milrinone 0.25 mcg/kg/min (01/19/22 0600)  ? nitroGLYCERIN Stopped (01/18/22 1757)  ? phenylephrine (NEO-SYNEPHRINE) Adult infusion 10 mcg/min (01/19/22 0600)  ? vancomycin Stopped (01/18/22 2239)  ? ?PRN Meds:.sodium chloride, albumin human, dextrose, lactated ringers, metoprolol tartrate, midazolam, morphine injection, ondansetron (ZOFRAN) IV, oxyCODONE, sodium chloride flush, traMADol ? ?General appearance: alert, cooperative, and no distress ?Neurologic: intact ?Heart: Sinus rhythm at a rate of 58 to 62/min (this is his baseline heart rate) ?Lungs: Breath sounds clear, diminished.  Chest tube drainage 360 mL for past 12 hours. ?Abdomen: Soft, nontender, few bowel sounds present. ?Extremities: Warm and well-perfused.  The right  lower extremity EVH incisions are covered with a dry dressing. ?Wound: There is a dry Aquacel dressing over the sternotomy incision. ? ?Lab Results: ?CBC: ?Recent Labs  ?  01/18/22 ?2144 01/19/22 ?0306 01/19/22 ?0309  ?WBC 14.4* 14.7*  --   ?HGB 8.5* 8.4* 7.5*  ?HCT 24.2* 23.7* 22.0*  ?PLT 125* 132*  --   ? ?BMET:  ?Recent Labs  ?  01/18/22 ?2144 01/19/22 ?0306 01/19/22 ?0309  ?NA 137 134* 136  ?K 4.5 4.1 4.2  ?CL 105 104  --   ?CO2 25 24  --   ?GLUCOSE 140* 137*  --   ?BUN 13 12  --   ?CREATININE 1.16 1.17  --   ?CALCIUM 7.0*  7.1*  --   ?  ?CMET: ?Lab Results  ?Component Value Date  ? WBC 14.7 (H) 01/19/2022  ? HGB 7.5 (L) 01/19/2022  ? HCT 22.0 (L) 01/19/2022  ? PLT 132 (L) 01/19/2022  ? GLUCOSE 137 (H) 01/19/2022  ? CHOL 192 01/13/2022  ? TRIG 286 (H) 01/13/2022  ? HDL 38 (L) 01/13/2022  ? LDLCALC 100 (H) 01/13/2022  ? ALT 133 (H) 01/18/2022  ? AST 79 (H) 01/18/2022  ? NA 136 01/19/2022  ? K 4.2 01/19/2022  ? CL 104 01/19/2022  ? CREATININE 1.17 01/19/2022  ? BUN 12 01/19/2022  ? CO2 24 01/19/2022  ? TSH 6.308 (H) 01/17/2022  ? INR 1.4 (H) 01/18/2022  ? HGBA1C 5.2 01/12/2022  ? ? ? ? ?PT/INR:  ?Recent Labs  ?  01/18/22 ?1553  ?LABPROT 16.7*  ?INR 1.4*  ? ?Radiology: [facility] Chest Port 1 View ? ?Result Date: 01/18/2022 ?CLINICAL DATA:  Status post CABG. EXAM: PORTABLE CHEST 1 VIEW COMPARISON:  01/17/2022 FINDINGS: Postop change from median sternotomy and CABG procedure. The ET tube tip is above the carina. There is an enteric tube with tip and side port below the GE junction. Bilateral chest tubes and mediastinal drain in place. No pneumothorax identified. No pleural effusion or edema. Atelectasis is identified within the left midlung and left base. IMPRESSION: 1. Status post CABG. 2. Satisfactory position of support apparatus. 3. No pneumothorax. 4. Left midlung and left base atelectasis. Electronically Signed   By: Kerby Moors M.D.   On: 01/18/2022 16:08   ? ? ?Assessment/Plan: ?S/P Procedure(s) (LRB): ?CORONARY ARTERY BYPASS GRAFTING (CABG), ON PUMP, TIMES FOUR, USING LEFT INTERNAL MAMMARY ARTERY AND RIGHT ENDOSCOPICALLY HARVESTED GREATER SAPHENOUS VEIN (N/A) ?TRANSESOPHAGEAL ECHOCARDIOGRAM (TEE) (N/A) ? ?-Postop day 1 CABG x4 after presenting with out of hospital cardiac arrest.  Preop ejection fraction 55 to 60%.  Stable cardiac rhythm and hemodynamics.  The Neo-Synephrine is off.  Cardiac index is consistently been around 3 since surgery.  Plan to D/C the PA catheter and wean the milrinone off.  Mobilize.  We will consider removing  the chest tubes later today after ambulation.ON ASA and statin. Convert the amiodarone to PO when tolerating a diet.  ? ?-Heme-expected acute blood loss anemia.  Hematocrit 22%.  He seems to be tolerating this reasonably well.  Will monitor and plan to transfuse if it drops any further. ? ?-Pulm-stable respiratory status since extubation.  Maintaining sats on 4 L per nasal cannula.  Encourage pulmonary hygiene. ? ?-GI-abdomen benign.  Few bowel sounds.  Will allow clear liquids today. ? ?-Renal-baseline creatinine 1.0.  Urine output adequate.  Weight is about 6 kg positive.  We will hold off on diuresis until milrinone is off. ? ?-Neuro-intact ? ?-DVT PPx-plan-subcu enoxaparin later today. ? ? ?  Antony Odea, PA-C ?[phone] ?01/19/2022 7:57 AM ? ?

## 2022-01-19 NOTE — Op Note (Signed)
NAME: Jeremy Winters, BAR MEDICAL RECORD NO: [id number] ACCOUNT NO: [id number] DATE OF BIRTH: 08/23/1966 FACILITY: [facility] LOCATION: [facility] PHYSICIAN: Ivin Poot III, MD  Operative Report   DATE OF PROCEDURE: 01/18/2022  PROCEDURES PERFORMED:    1.  Coronary artery bypass grafting x 4 (left internal mammary artery to distal LAD, saphenous vein graft to posterolateral branch of the right coronary, saphenous vein graft to OM1, saphenous vein graft to OM2). 2. Endoscopic harvest of right leg greater saphenous vein.  SURGEON:  Ivin Poot, M.D.  ASSISTANT:  Enid Cutter, PA-C.  A surgical first assistant was needed for this procedure because of its complexity. The first assistant was needed to harvest the saphenous vein endoscopically and close the leg incisions as well as to provide  assistance with the distal anastomoses with exposure, suctioning, and suture management.  ANESTHESIA:  General by Dr. Annye Asa  PREOPERATIVE DIAGNOSES: Severe 3-vessel coronary artery disease, status post cardiac arrest with successful out-of-hospital resuscitation.  POSTOPERATIVE DIAGNOSES: Severe 3-vessel coronary artery disease, status post cardiac arrest with successful out-of-hospital resuscitation.  CLINICAL NOTE PROCEDURE:  The patient is a 56 year old nonsmoker, nondiabetic, who was on the treadmill at a fitness center and collapsed with a V-Fib arrest.  EMS arrived and began CPR and advanced cardiac support protocol with IV medications and DC  cardioversion.  The third attempt of cardioversion was successful and a pulse was obtained.  The patient was intubated and transferred to the ED.  He was subsequently seen by Cardiology and underwent echo and cardiac catheterization.  Echo showed reduced  global LV function.  The catheterization showed significant 3-vessel coronary artery disease with total occlusion of the circumflex.  He recovered from the vent and was extubated and had CT scan  that was negative and neurologic exam was at baseline.   Surgical coronary revascularization was recommended.  I examined the patient after reviewing his cardiac catheterization and echo and reviewing the medical record.  I discussed the benefits of coronary bypass grafting and agreed with the recommendation for this treatment of his severe coronary artery  disease.  I discussed the major details of surgery including the use of general anesthesia and cardiopulmonary bypass, the expected postoperative hospital recovery, the location of the surgical incisions, and the potential need for blood products.  I  discussed the potential risks to him of the surgery including the risks of stroke, bleeding, blood transfusion requirement, infection, organ failure, arrhythmias, death.  He demonstrated his understanding and agreed to proceed with surgery under informed  consent.  OPERATIVE FINDINGS:    1.  Adequate conduit. 2.  No myocardial scarring noted. 3.  LVH consistent with probable longstanding hypertension. 4.  Severe distal RCA disease with a large dominant RCA, mild proximal left main and LAD disease with a high-grade distal stenosis that was past the LV apex and a small, heavily diseased vessel at that point, occlusion of the circumflex with an OM1,  which was a large vessel, good target and the OM2, which was a small suboptimal target, but graftable.  DESCRIPTION OF PROCEDURE:  The patient was brought from preoperative holding where informed consent was document and the final issues were addressed with the patient in a face-to-face encounter.  The patient was then brought to the operating room and  placed supine on the operating table.  General anesthesia was induced under invasive hemodynamic monitoring.  A transesophageal echo probe was placed by the anesthesia team.  The patient remained stable.  The patient was then  prepped and draped as a  sterile field.  A proper timeout was performed.  A  sternal incision was made as the saphenous vein was harvested endoscopically from the right leg.  The internal mammary artery was harvested as a pedicle graft from its origin at the subclavian vessels.   It was a good vessel, 1.5 mm with good flow.  The sternal retractor was placed and the pericardium was opened and suspended.  Pursestrings were placed in the ascending aorta and right atrium and the patient was cannulated.  When the ACT was documented as  being therapeutic, the patient was placed on cardiopulmonary bypass.  The coronary vessels were inspected for the site of the anastomosis.  Cardioplegia cannulas were placed both antegrade and retrograde cold blood cardioplegia.  The mammary artery and  vein grafts were prepared for the distal anastomosis.  The patient was being cooled to 32 degrees.  The aortic crossclamp was applied and a liter of cold blood cardioplegia was delivered in split doses between the antegrade aortic and retrograde coronary  sinus catheters.  There was good cardioplegic arrest and supple temperature dropped less than 14 degrees.  Cardioplegia was delivered every 20 minutes.  The distal coronary anastomoses were performed.  The first distal anastomosis was to the posterolateral branch of the right coronary.  This was a dominant 1.5 mm vessel with proximal 80% stenosis.  A reverse saphenous vein was sewn end-to-side with  running 7-0 Prolene with good flow through the graft.  Cardioplegia was redosed.  The second distal anastomosis was the OM2 branch of the occluded circumflex.  This was 1.0 mm vessel and a saphenous vein of the smaller diameter was sewn end-to-side with running 7-0 Prolene with adequate flow through the graft.  Cardioplegia was  redosed.  The third distal anastomosis was to the larger OM1, which was a 1.5 mm vessel.  A reverse saphenous vein was sewn end-to-side with running 7-0 Prolene with good flow through the graft.  Cardioplegia was redosed.  The  fourth distal anastomosis was to the distal LAD as far as it could be reached.  There is diffuse fairly thick plaque of the LAD proximally.  The left IMA pedicle was brought through an opening in the pericardium and brought down onto the LAD and sewn  end-to-side with running 8-0 Prolene.  There was good flow through the anastomosis after briefly releasing the pedicle bulldog and the mammary artery.  The bulldog was reapplied and the pedicle was secured to the epicardium.  Cardioplegia was redosed.  While the cross-clamp was still in place, 2 proximal vein anastomoses were performed using a 4.5 mm punch and running 6-0 Prolene.  The 2 veins anastomosed to the aorta where the PL vein and the OM1 vein.  The OM2 vein was too small to sew directly to  the aorta and was sewn end-to-side to the hood of the OM1 vein with running 7-0 Prolene.  Retrograde warm blood cardioplegia was given. The usual de-airing maneuvers were performed and the cross-clamp was then removed.  The heart resumed a spontaneous rhythm.  The patient was rewarmed and reperfused. The vein grafts were de-aired and opened.  Each had good flow.  Hemostasis was documented at the proximal and distal anastomoses.  Temporary pacing wires were applied.  The  lungs were expanded and the ventilator was resumed.  The patient was weaned from cardiopulmonary bypass without difficulty.  Echo showed normal LV function.  Protamine was administered without adverse reaction.  The cannulas  were removed.  After the  protamine, there was still significant diffuse oozing and the patient was given 2 units of FFP to improve coagulation.  The anterior mediastinal fat was closed over the aorta.  Anterior mediastinal and bilateral pleural tubes were placed and brought out through separate incisions.  The sternum was closed with interrupted steel wire.  The patient remained stable.  The  pectoralis fascia and subcutaneous layers were closed with running Vicryl.  The  skin was closed with a subcuticular.  The patient returned to the ICU in stable condition.  Total bypass time 170 minutes.   MUK D: 01/19/2022 11:06:57 am T: 01/19/2022 10:39:00 pm  JOB: [id number]

## 2022-01-19 NOTE — Progress Notes (Signed)
EVENING ROUNDS NOTE : ? ?   ?[address] ?      [place] [address] ?            [phone]   ?              ?1 Day Post-Op ?Procedure(s) (LRB): ?CORONARY ARTERY BYPASS GRAFTING (CABG), ON PUMP, TIMES FOUR, USING LEFT INTERNAL MAMMARY ARTERY AND RIGHT ENDOSCOPICALLY HARVESTED GREATER SAPHENOUS VEIN (N/A) ?TRANSESOPHAGEAL ECHOCARDIOGRAM (TEE) (N/A) ? ? ?Total Length of Stay:  LOS: 7 days  ?Events:   ?No events today ?Lots of anxiety ? ? ? ?BP 112/62   Pulse 69   Temp 97.7 ?F (36.5 ?C)   Resp (!) 30   Ht 5\' 9"  (1.753 m)   Wt 95.9 kg   SpO2 95%   BMI 31.22 kg/m?  ? ?PAP: (23-44)/(13-24) 41/19 ?CO:  [4.3 L/min-7 L/min] 5.9 L/min ?CI:  [2.1 L/min/m2-3.4 L/min/m2] 2.9 L/min/m2 ? ?Vent Mode: PSV;CPAP ?FiO2 (%):  [40 %] 40 % ?Set Rate:  [4 bmp] 4 bmp ?Vt Set:  [620 mL] 620 mL ?PEEP:  [5 cmH20] 5 cmH20 ?Pressure Support:  [10 cmH20] 10 cmH20 ? ? sodium chloride Stopped (01/18/22 2139)  ? sodium chloride Stopped (01/19/22 0725)  ? sodium chloride 20 mL/hr at 01/18/22 1540  ?  ceFAZolin (ANCEF) IV 2 g (01/19/22 1313)  ? lactated ringers Stopped (01/18/22 1545)  ? lactated ringers 20 mL/hr at 01/19/22 1100  ? milrinone 0.125 mcg/kg/min (01/19/22 1100)  ? phenylephrine (NEO-SYNEPHRINE) Adult infusion Stopped (01/19/22 0731)  ? ? ?I/O last 3 completed shifts: ?In: 5544.4 [I.V.:3789.7; Blood:741; IV Piggyback:1013.7] ?Out: [id number]; Emesis/NG output:30; Blood:600; Chest Tube:560] ? ? ?CBC Latest Ref Rng & Units 01/19/2022 01/19/2022 01/18/2022  ?WBC 4.0 - 10.5 K/uL - 14.7(H) 14.4(H)  ?Hemoglobin 13.0 - 17.0 g/dL 7.5(L) 8.4(L) 8.5(L)  ?Hematocrit 39.0 - 52.0 % 22.0(L) 23.7(L) 24.2(L)  ?Platelets 150 - 400 K/uL - 132(L) 125(L)  ? ? ?BMP Latest Ref Rng & Units 01/19/2022 01/19/2022 01/18/2022  ?Glucose 70 - 99 mg/dL - 137(H) 140(H)  ?BUN 6 - 20 mg/dL - 12 13  ?Creatinine 0.61 - 1.24 mg/dL - 1.17 1.16  ?Sodium 135 - 145 mmol/L 136 134(L) 137  ?Potassium 3.5 - 5.1 mmol/L 4.2 4.1 4.5  ?Chloride 98 - 111 mmol/L -  104 105  ?CO2 22 - 32 mmol/L - 24 25  ?Calcium 8.9 - 10.3 mg/dL - 7.1(L) 7.0(L)  ? ? ?ABG ?   ?Component Value Date/Time  ? PHART 7.466 (H) 01/19/2022 0309  ? PCO2ART 34.4 01/19/2022 0309  ? PO2ART 72 (L) 01/19/2022 0309  ? HCO3 24.7 01/19/2022 0309  ? TCO2 26 01/19/2022 0309  ? ACIDBASEDEF 1.0 01/18/2022 1841  ? O2SAT 95 01/19/2022 0309  ? ? ? ? ? ?Melodie Bouillon, MD ?01/19/2022 4:08 PM ? ? ?

## 2022-01-19 NOTE — Progress Notes (Incomplete)
0700 - Report received from PM RN.  All questions answered.  Safety checks performed.  All lines and drips verified. ?Hand hygiene performed before/after each pt contact. ?1 Joette Catching, PA rounded. PM AAI 60, 10 ?0800 - Assessment. ?0900 - Rx. ?1000 - Dr. Maren Beach rounded.  PM AAI 70, 10. ?1030 - A-line and Swan D/C'd. Pt stood at bedside w/50 mL output from Cx Tube. ?1330 - Foley D/C'd.  Pt stood at bedside and then assisted to chair. 60 mL Cx Tube output. ?1400 - Myron, PA rounded. ?1530 - Pt walked in the unit, began having some pain, walked back to his room, and then pain Rx given.  50 mL Cx Tube output during walk. ?1800 - Pt stood, no appreciable volume from Cx tube. ?1900 - Report given to PM RN.  All questions answered. ? ?

## 2022-01-20 ENCOUNTER — Inpatient Hospital Stay (HOSPITAL_COMMUNITY): Payer: 59

## 2022-01-20 LAB — BASIC METABOLIC PANEL
Anion gap: 8 (ref 5–15)
BUN: 15 mg/dL (ref 6–20)
CO2: 27 mmol/L (ref 22–32)
Calcium: 7.3 mg/dL — ABNORMAL LOW (ref 8.9–10.3)
Chloride: 97 mmol/L — ABNORMAL LOW (ref 98–111)
Creatinine, Ser: 1.12 mg/dL (ref 0.61–1.24)
GFR, Estimated: >60 mL/min (ref >60)
Glucose, Bld: 126 mg/dL — ABNORMAL HIGH (ref 70–99)
Potassium: 3.7 mmol/L (ref 3.5–5.1)
Sodium: 132 mmol/L — ABNORMAL LOW (ref 135–145)

## 2022-01-20 LAB — GLUCOSE, CAPILLARY
Glucose-Capillary: 138 mg/dL — ABNORMAL HIGH (ref 70–99)
Glucose-Capillary: 147 mg/dL — ABNORMAL HIGH (ref 70–99)
Glucose-Capillary: 144 mg/dL — ABNORMAL HIGH (ref 70–99)
Glucose-Capillary: 120 mg/dL — ABNORMAL HIGH (ref 70–99)

## 2022-01-20 LAB — CBC
HCT: 23.8 % — ABNORMAL LOW (ref 39.0–52.0)
Hemoglobin: 8.2 g/dL — ABNORMAL LOW (ref 13.0–17.0)
MCH: 31.1 pg (ref 26.0–34.0)
MCHC: 34.5 g/dL (ref 30.0–36.0)
MCV: 90.2 fL (ref 80.0–100.0)
Platelets: 139 10*3/uL — ABNORMAL LOW (ref 150–400)
RBC: 2.64 MIL/uL — ABNORMAL LOW (ref 4.22–5.81)
RDW: 13.5 % (ref 11.5–15.5)
WBC: 15.6 10*3/uL — ABNORMAL HIGH (ref 4.0–10.5)
nRBC: 0 % (ref 0.0–0.2)

## 2022-01-20 MED ORDER — FE FUMARATE-B12-VIT C-FA-IFC PO CAPS
1.0000 | ORAL_CAPSULE | Freq: Three times a day (TID) | ORAL | Status: DC
  Administered 2022-01-20 – 2022-01-26 (×17): 1 via ORAL
  Filled 2022-01-20 (×16): qty 1

## 2022-01-20 MED ORDER — POTASSIUM CHLORIDE CRYS ER 20 MEQ PO TBCR
20.0000 meq | EXTENDED_RELEASE_TABLET | ORAL | Status: AC
  Administered 2022-01-20 (×3): 20 meq via ORAL
  Filled 2022-01-20 (×3): qty 1

## 2022-01-20 MED ORDER — ORAL CARE MOUTH RINSE
15.0000 mL | Freq: Two times a day (BID) | OROMUCOSAL | Status: DC
  Administered 2022-01-20 – 2022-01-21 (×3): 15 mL via OROMUCOSAL

## 2022-01-20 MED FILL — Calcium Chloride Inj 10%: INTRAVENOUS | Qty: 10 | Status: AC

## 2022-01-20 MED FILL — Mannitol IV Soln 20%: INTRAVENOUS | Qty: 500 | Status: AC

## 2022-01-20 MED FILL — Lidocaine HCl (Cardiac) IV PF Soln 100 MG/5ML (2%): INTRAVENOUS | Qty: 5 | Status: AC

## 2022-01-20 MED FILL — Albumin, Human Inj 5%: INTRAVENOUS | Qty: 250 | Status: AC

## 2022-01-20 NOTE — Progress Notes (Addendum)
[facility] DAILY ICU PROGRESS NOTE ? ?                 [address] ?           [place] [address] ?         [phone]  ? ?2 Days Post-Op ?Procedure(s) (LRB): ?CORONARY ARTERY BYPASS GRAFTING (CABG), ON PUMP, TIMES FOUR, USING LEFT INTERNAL MAMMARY ARTERY AND RIGHT ENDOSCOPICALLY HARVESTED GREATER SAPHENOUS VEIN (N/A) ?TRANSESOPHAGEAL ECHOCARDIOGRAM (TEE) (N/A) ? ?Total Length of Stay:  LOS: 8 days  ? ?Subjective: ?Awake and alert, pain control adequate.  Walked in the hall this morning.  No complaints or concerns. ? ?Milrinone at 0.125 mics per kilogram per minute ? ?Tolerating p.o.'s but no flatus or BM yet. ? ?Objective: ?Vital signs in last 24 hours: ?Temp:  [97.7 ?F (36.5 ?C)-99 ?F (37.2 ?C)] 98.4 ?F (36.9 ?C) (03/03 0726) ?Pulse Rate:  [61-70] 69 (03/03 0800) ?Cardiac Rhythm: Atrial paced (03/03 0800) ?Resp:  [16-36] 16 (03/03 0800) ?BP: (103-128)/(59-74) 116/74 (03/03 0800) ?SpO2:  [86 %-98 %] 96 % (03/03 0800) ?Arterial Line BP: (140)/(60) 140/60 (03/02 1000) ?Weight:  [97.2 kg] 97.2 kg (03/03 0600) ? ?Filed Weights  ? 01/18/22 0530 01/19/22 0500 01/20/22 0600  ?Weight: 89 kg 95.9 kg 97.2 kg  ? ? ?Weight change: 1.3 kg  ? ?Hemodynamic parameters for last 24 hours: ?PAP: (41)/(19) 41/19 ? ?Intake/Output from previous day: ?03/02 0701 - 03/03 0700 ?In: 1689.7 [P.O.:720; I.V.:738.2; IV Piggyback:231.5] ?Out: 1410 [Urine:1010; Chest Tube:400] ? ?Intake/Output this shift: ?Total I/O ?In: 23.3 [I.V.:23.3] ?Out: 0  ? ?Current Meds: ?Scheduled Meds: ? acetaminophen  1,000 mg Oral Q6H  ? amiodarone  200 mg Oral BID  ? aspirin EC  325 mg Oral Daily  ? atorvastatin  80 mg Oral Daily  ? bisacodyl  10 mg Oral Daily  ? Or  ? bisacodyl  10 mg Rectal Daily  ? Chlorhexidine Gluconate Cloth  6 each Topical Daily  ? docusate sodium  200 mg Oral Daily  ? enoxaparin (LOVENOX) injection  40 mg Subcutaneous QHS  ? furosemide  20 mg Intravenous BID  ? insulin aspart  0-24 Units Subcutaneous TID AC & HS  ? insulin  detemir  12 Units Subcutaneous Daily  ? mouth rinse  15 mL Mouth Rinse BID  ? metoprolol tartrate  12.5 mg Oral BID  ? pantoprazole  40 mg Oral Daily  ? potassium chloride  20 mEq Oral Q4H  ? sodium chloride flush  3 mL Intravenous Q12H  ? ?Continuous Infusions: ? sodium chloride Stopped (01/18/22 2139)  ? sodium chloride Stopped (01/19/22 0725)  ? sodium chloride 20 mL/hr at 01/18/22 1540  ?  ceFAZolin (ANCEF) IV 2 g (01/20/22 0538)  ? lactated ringers Stopped (01/18/22 1545)  ? lactated ringers 20 mL/hr at 01/20/22 0800  ? milrinone 0.125 mcg/kg/min (01/20/22 0800)  ? phenylephrine (NEO-SYNEPHRINE) Adult infusion Stopped (01/19/22 0731)  ? ?PRN Meds:.sodium chloride, metoprolol tartrate, midazolam, morphine injection, ondansetron (ZOFRAN) IV, oxyCODONE, sodium chloride flush, traMADol ? ?General appearance: alert, cooperative, and no distress ?Neurologic: intact ?Heart: Sinus rhythm.  rate improved to 65 to 68/min (he normally has a slow heart rate) ?Lungs: Breath sounds clear, diminished.  Chest tube drainage 180 mL for past 12 hours. ?Abdomen: Soft, nontender, few bowel sounds present. ?Extremities: Warm and well-perfused.  The right lower extremity EVH incisions are covered with a dry dressing. ?Wound: There is a dry Aquacel dressing over the sternotomy incision. ? ?Lab Results: ?  CBC: ?Recent Labs  ?  01/19/22 ?1658 01/20/22 ?0145  ?WBC 16.1* 15.6*  ?HGB 8.5* 8.2*  ?HCT 25.2* 23.8*  ?PLT 146* 139*  ? ? ?BMET:  ?Recent Labs  ?  01/19/22 ?1658 01/20/22 ?0145  ?NA 132* 132*  ?K 3.9 3.7  ?CL 99 97*  ?CO2 25 27  ?GLUCOSE 148* 126*  ?BUN 14 15  ?CREATININE 1.33* 1.12  ?CALCIUM 7.3* 7.3*  ? ?  ?CMET: ?Lab Results  ?Component Value Date  ? WBC 15.6 (H) 01/20/2022  ? HGB 8.2 (L) 01/20/2022  ? HCT 23.8 (L) 01/20/2022  ? PLT 139 (L) 01/20/2022  ? GLUCOSE 126 (H) 01/20/2022  ? CHOL 192 01/13/2022  ? TRIG 286 (H) 01/13/2022  ? HDL 38 (L) 01/13/2022  ? LDLCALC 100 (H) 01/13/2022  ? ALT 133 (H) 01/18/2022  ? AST 79 (H)  01/18/2022  ? NA 132 (L) 01/20/2022  ? K 3.7 01/20/2022  ? CL 97 (L) 01/20/2022  ? CREATININE 1.12 01/20/2022  ? BUN 15 01/20/2022  ? CO2 27 01/20/2022  ? TSH 6.308 (H) 01/17/2022  ? INR 1.4 (H) 01/18/2022  ? HGBA1C 5.2 01/12/2022  ? ? ? ? ?PT/INR:  ?Recent Labs  ?  01/18/22 ?1553  ?LABPROT 16.7*  ?INR 1.4*  ? ? ?Radiology: [facility] Chest Port 1 View ? ?Result Date: 01/20/2022 ?CLINICAL DATA:  Post CABG, chest tubes EXAM: PORTABLE CHEST 1 VIEW COMPARISON:  Portable exam 0507 hours compared to 01/19/2022 FINDINGS: Mediastinal drain and BILATERAL thoracostomy tubes present. RIGHT jugular catheter tip projects over proximal SVC. Epicardial pacing wires noted. Enlargement of cardiac silhouette and prominence of mediastinum consistent with CABG. Decreased lung volumes with bibasilar atelectasis. No pleural effusion or pneumothorax. IMPRESSION: Stable postoperative changes CABG. Decreased lung volumes with bibasilar atelectasis. Electronically Signed   By: Lavonia Dana M.D.   On: 01/20/2022 08:51   ? ? ?Assessment/Plan: ?S/P Procedure(s) (LRB): ?CORONARY ARTERY BYPASS GRAFTING (CABG), ON PUMP, TIMES FOUR, USING LEFT INTERNAL MAMMARY ARTERY AND RIGHT ENDOSCOPICALLY HARVESTED GREATER SAPHENOUS VEIN (N/A) ?TRANSESOPHAGEAL ECHOCARDIOGRAM (TEE) (N/A) ? ?-Postop day 2 CABG x4 after presenting with out of hospital cardiac arrest.  Preop ejection fraction 55 to 60%.  Stable cardiac rhythm and hemodynamics.  The Neo-Synephrine is off.   Will d/c the milrinone today.   Remove the chest tubes and advance activity. On ASA and statin. On oral amiodarone for cardiac arrest prior to admission.  ? ?-Heme-expected acute blood loss anemia.  Hematocrit 24%.  He seems to be tolerating this reasonably well.  Monitor. ? ?-Pulm-stable respiratory status since extubation.  Maintaining sats on 4 L per nasal cannula.  Chest x-ray showing bibasilar atelectasis.  Needs to work on pulmonary hygiene. ? ?-GI-abdomen benign.  Few bowel sounds.  No bowel function  yet.  Diet as tolerated ? ?-Renal-baseline creatinine 1.0.  Urine output adequate.  Weight is about 8 kg positive.  BP stable off neo-.  Begin diuresis. ? ?-Neuro-intact ? ?-DVT PPx-continue enoxaparin daily ? ? ?Antony Odea, PA-C ?[phone] ?01/20/2022 9:58 AM ? ?Patient seen and examined, agree with above ?Doing well off milrinone. ? ?Revonda Standard. Roxan Hockey, MD ?[facility] ?[phone] ? ?

## 2022-01-21 ENCOUNTER — Inpatient Hospital Stay (HOSPITAL_COMMUNITY): Payer: 59

## 2022-01-21 LAB — TYPE AND SCREEN
ABO/RH(D): A NEG
Antibody Screen: NEGATIVE
Unit division: 0
Unit division: 0

## 2022-01-21 LAB — BASIC METABOLIC PANEL
Anion gap: 9 (ref 5–15)
BUN: 21 mg/dL — ABNORMAL HIGH (ref 6–20)
CO2: 26 mmol/L (ref 22–32)
Calcium: 7.3 mg/dL — ABNORMAL LOW (ref 8.9–10.3)
Chloride: 94 mmol/L — ABNORMAL LOW (ref 98–111)
Creatinine, Ser: 1.13 mg/dL (ref 0.61–1.24)
GFR, Estimated: >60 mL/min (ref >60)
Glucose, Bld: 112 mg/dL — ABNORMAL HIGH (ref 70–99)
Potassium: 4.6 mmol/L (ref 3.5–5.1)
Sodium: 129 mmol/L — ABNORMAL LOW (ref 135–145)

## 2022-01-21 LAB — BPAM RBC
Blood Product Expiration Date: 202303222359
Blood Product Expiration Date: 202303232359
ISSUE DATE / TIME: 202303010840
ISSUE DATE / TIME: 202303010840
Unit Type and Rh: 600
Unit Type and Rh: 600

## 2022-01-21 LAB — CBC
HCT: 25.2 % — ABNORMAL LOW (ref 39.0–52.0)
Hemoglobin: 8.7 g/dL — ABNORMAL LOW (ref 13.0–17.0)
MCH: 31.1 pg (ref 26.0–34.0)
MCHC: 34.5 g/dL (ref 30.0–36.0)
MCV: 90 fL (ref 80.0–100.0)
Platelets: 173 10*3/uL (ref 150–400)
RBC: 2.8 MIL/uL — ABNORMAL LOW (ref 4.22–5.81)
RDW: 13.8 % (ref 11.5–15.5)
WBC: 18.9 10*3/uL — ABNORMAL HIGH (ref 4.0–10.5)
nRBC: 0.3 % — ABNORMAL HIGH (ref 0.0–0.2)

## 2022-01-21 LAB — GLUCOSE, CAPILLARY
Glucose-Capillary: 109 mg/dL — ABNORMAL HIGH (ref 70–99)
Glucose-Capillary: 165 mg/dL — ABNORMAL HIGH (ref 70–99)
Glucose-Capillary: 132 mg/dL — ABNORMAL HIGH (ref 70–99)
Glucose-Capillary: 89 mg/dL (ref 70–99)

## 2022-01-21 MED ORDER — ATORVASTATIN CALCIUM 80 MG PO TABS
80.0000 mg | ORAL_TABLET | Freq: Every day | ORAL | Status: DC
  Administered 2022-01-22 – 2022-01-26 (×5): 80 mg via ORAL
  Filled 2022-01-21 (×5): qty 1

## 2022-01-21 MED ORDER — MAGNESIUM HYDROXIDE 400 MG/5ML PO SUSP: 30.0000 mL | Freq: Every day | ORAL | Status: DC | PRN

## 2022-01-21 MED ORDER — DOCUSATE SODIUM 100 MG PO CAPS
200.0000 mg | ORAL_CAPSULE | Freq: Every day | ORAL | Status: DC
  Administered 2022-01-22 – 2022-01-26 (×4): 200 mg via ORAL
  Filled 2022-01-21 (×4): qty 2

## 2022-01-21 MED ORDER — POLYETHYLENE GLYCOL 3350 17 G PO PACK
17.0000 g | PACK | Freq: Once | ORAL | Status: AC
  Administered 2022-01-21: 17 g via ORAL
  Filled 2022-01-21: qty 1

## 2022-01-21 MED ORDER — SODIUM CHLORIDE 0.9% FLUSH
3.0000 mL | Freq: Two times a day (BID) | INTRAVENOUS | Status: DC
  Administered 2022-01-21 – 2022-01-26 (×10): 3 mL via INTRAVENOUS

## 2022-01-21 MED ORDER — PROCHLORPERAZINE EDISYLATE 10 MG/2ML IJ SOLN
10.0000 mg | Freq: Four times a day (QID) | INTRAMUSCULAR | Status: DC | PRN
  Administered 2022-01-21: 10 mg via INTRAVENOUS
  Filled 2022-01-21: qty 2

## 2022-01-21 MED ORDER — ALUM & MAG HYDROXIDE-SIMETH 200-200-20 MG/5ML PO SUSP: 15.0000 mL | ORAL | Status: DC | PRN

## 2022-01-21 MED ORDER — ONDANSETRON HCL 4 MG/2ML IJ SOLN: 4.0000 mg | Freq: Four times a day (QID) | INTRAMUSCULAR | Status: DC | PRN

## 2022-01-21 MED ORDER — SODIUM CHLORIDE 0.9% FLUSH: 3.0000 mL | INTRAVENOUS | Status: DC | PRN

## 2022-01-21 MED ORDER — POTASSIUM CHLORIDE CRYS ER 20 MEQ PO TBCR
20.0000 meq | EXTENDED_RELEASE_TABLET | Freq: Two times a day (BID) | ORAL | Status: DC
  Administered 2022-01-21 – 2022-01-26 (×11): 20 meq via ORAL
  Filled 2022-01-21 (×11): qty 1

## 2022-01-21 MED ORDER — SODIUM CHLORIDE 0.9 % IV SOLN: 250.0000 mL | INTRAVENOUS | Status: DC | PRN

## 2022-01-21 MED ORDER — ACETAMINOPHEN 325 MG PO TABS
650.0000 mg | ORAL_TABLET | Freq: Four times a day (QID) | ORAL | Status: DC | PRN
  Administered 2022-01-22 – 2022-01-26 (×3): 650 mg via ORAL
  Filled 2022-01-21 (×3): qty 2

## 2022-01-21 MED ORDER — TRAMADOL HCL 50 MG PO TABS
50.0000 mg | ORAL_TABLET | ORAL | Status: DC | PRN
  Administered 2022-01-22 – 2022-01-26 (×13): 100 mg via ORAL
  Filled 2022-01-21 (×13): qty 2

## 2022-01-21 MED ORDER — OXYCODONE HCL 5 MG PO TABS
5.0000 mg | ORAL_TABLET | ORAL | Status: DC | PRN
  Administered 2022-01-21 – 2022-01-26 (×8): 10 mg via ORAL
  Filled 2022-01-21 (×8): qty 2

## 2022-01-21 MED ORDER — PANTOPRAZOLE SODIUM 40 MG PO TBEC
40.0000 mg | DELAYED_RELEASE_TABLET | Freq: Every day | ORAL | Status: DC
  Administered 2022-01-22 – 2022-01-26 (×5): 40 mg via ORAL
  Filled 2022-01-21 (×5): qty 1

## 2022-01-21 MED ORDER — ~~LOC~~ CARDIAC SURGERY, PATIENT & FAMILY EDUCATION: Freq: Once | Status: AC

## 2022-01-21 MED ORDER — ONDANSETRON HCL 4 MG PO TABS: 4.0000 mg | ORAL_TABLET | Freq: Four times a day (QID) | ORAL | Status: DC | PRN

## 2022-01-21 MED ORDER — TORSEMIDE 20 MG PO TABS
20.0000 mg | ORAL_TABLET | Freq: Every day | ORAL | Status: DC
  Administered 2022-01-21 – 2022-01-26 (×6): 20 mg via ORAL
  Filled 2022-01-21 (×6): qty 1

## 2022-01-21 NOTE — Progress Notes (Signed)
Pt transferred via wheelchair by charge RN Christine to 4E. Pt had 1 bag of personal belongings and cell phone at time of transfer.  ?

## 2022-01-21 NOTE — Evaluation (Signed)
Physical Therapy Evaluation Patient Details Name: Jeremy Winters MRN: [id number] DOB: 20-Oct-1966 Today's Date: 01/21/2022  History of Present Illness  The pt is a 56 yo male presenting 2/23 after cardiac arrest while at the gym. Pt required x2 Epi, x3 shocks, and 18 min CPR, found to have STEMI with Vfib/vtach arrest. S/p L heart cath 2/23, CABG x4 on 3/1.   Clinical Impression  Pt in bed upon arrival of PT, agreeable to evaluation at this time. Prior to admission the pt was independent with al mobility and ADLs, has recently completed half-marathon and trains for triathlons. The pt now presents with limitations in functional mobility, endurance, and dynamic stability due to above dx, and will continue to benefit from skilled PT to address these deficits. He benefits from minA to complete bed mobility and initial sit-stand transfer, and minG to complete hallway ambulation with use of RW. He is highly motivated to return to prior level of mobility, independence, and exercise. Will benefit from acute PT to progress activity tolerance and stability but will be safe to d/c with his spouse and is interested in outpatient cardiac rehab at d/c.      Gait Speed: 0.31 m/s using RW and with 3LO2. (Gait speed <0.34m/s indicates increased risk of falls and dependence in ADLs)    Recommendations for follow up therapy are one component of a multi-disciplinary discharge planning process, led by the attending physician.  Recommendations may be updated based on patient status, additional functional criteria and insurance authorization.  Follow Up Recommendations No PT follow up (phase 2 cardiac rehab)    Assistance Recommended at Discharge Intermittent Supervision/Assistance  Patient can return home with the following  A little help with walking and/or transfers;A little help with bathing/dressing/bathroom;Assistance with cooking/housework;Assist for transportation;Help with stairs or ramp for entrance     Equipment Recommendations Rolling walker (2 wheels);BSC/3in1  Recommendations for Other Services       Functional Status Assessment Patient has had a recent decline in their functional status and demonstrates the ability to make significant improvements in function in a reasonable and predictable amount of time.     Precautions / Restrictions Precautions Precautions: Sternal;Fall Precaution Booklet Issued: Yes (comment) Precaution Comments: watch SpO2, on 3L for mobility Restrictions Weight Bearing Restrictions: Yes      Mobility  Bed Mobility Overal bed mobility: Needs Assistance Bed Mobility: Rolling, Sidelying to Sit Rolling: Min assist Sidelying to sit: Min assist       General bed mobility comments: minA to complete trunk elevation    Transfers Overall transfer level: Needs assistance Equipment used: None Transfers: Sit to/from Stand Sit to Stand: Min assist           General transfer comment: minA to steady, able to power up without UE support    Ambulation/Gait Ambulation/Gait assistance: Min guard Gait Distance (Feet): 150 Feet (x2 standing rest breaks) Assistive device: Rolling walker (2 wheels) Gait Pattern/deviations: Step-through pattern, Decreased stride length, Trunk flexed Gait velocity: 0.31 m/s Gait velocity interpretation: <1.31 ft/sec, indicative of household ambulator   General Gait Details: mild trunk flexion and cues for increased breathing. SpO2 dropped to low of 88% and did not return to 90s with standing rest, increased to 3L and immediately to 94% and maintained with remaining gait     Balance Overall balance assessment: Needs assistance Sitting-balance support: No upper extremity supported, Feet supported Sitting balance-Leahy Scale: Good     Standing balance support: Bilateral upper extremity supported, During functional activity Standing balance-Leahy Scale:  Fair Standing balance comment: able to stand with minA but no UE,  then minG with BUE support                             Pertinent Vitals/Pain Pain Assessment Pain Assessment: 0-10 Pain Score: 4  Pain Location: incision Pain Descriptors / Indicators: Discomfort Pain Intervention(s): Monitored during session, RN gave pain meds during session    [place] expects to be discharged to:: Private residence Living Arrangements: Spouse/significant other Available Help at Discharge: Family;Available PRN/intermittently;Available 24 hours/day Type of Home: House Home Access: Stairs to enter Entrance Stairs-Rails: Psychiatric nurse of Steps: 4 Alternate Level Stairs-Number of Steps: flight Home Layout: Two level Home Equipment: Grab bars - tub/shower      Prior Function Prior Level of Function : Independent/Modified Independent;Driving;Working/employed             Mobility Comments: independent, doing Engineer, maintenance (IT) and half marathons ADLs Comments: working as Designer, jewellery, works from office     [place]: Right    Extremity/Trunk Assessment   Upper Extremity Assessment Upper Extremity Assessment: Overall WFL for tasks assessed    Lower Extremity Assessment Lower Extremity Assessment: Overall WFL for tasks assessed    Cervical / Trunk Assessment Cervical / Trunk Assessment: Other exceptions Cervical / Trunk Exceptions: cardiothoracic surgery  Communication   Communication: No difficulties  Cognition Arousal/Alertness: Awake/alert Behavior During Therapy: Flat affect Overall Cognitive Status: Impaired/Different from baseline Area of Impairment: Memory, Awareness, Problem solving                     Memory: Decreased short-term memory     Awareness: Emergent Problem Solving: Slow processing, Decreased initiation, Requires verbal cues General Comments: verbal cues for mobility and safety. flat affect but able to answer questions appropriately         General Comments General comments (skin integrity, edema, etc.): SpO2 stable at rest on 2L, able to ambulate ~100 ft with 2L and VSS, then dropped to low of 88% and did not return to 90s with standing rest, increased to 3L and immediately to 94% and maintained with gait.    Exercises     Assessment/Plan    PT Assessment Patient needs continued PT services  PT Problem List Decreased activity tolerance;Decreased balance;Decreased mobility;Cardiopulmonary status limiting activity       PT Treatment Interventions Gait training;DME instruction;Stair training;Functional mobility training;Therapeutic activities;Therapeutic exercise;Balance training;Patient/family education    PT Goals (Current goals can be found in the Care Plan section)  Acute Rehab PT Goals Patient Stated Goal: return to exercising PT Goal Formulation: With patient Time For Goal Achievement: 02/04/22 Potential to Achieve Goals: Good    Frequency Min 3X/week        AM-PAC PT "6 Clicks" Mobility  Outcome Measure Help needed turning from your back to your side while in a flat bed without using bedrails?: A Little Help needed moving from lying on your back to sitting on the side of a flat bed without using bedrails?: A Little Help needed moving to and from a bed to a chair (including a wheelchair)?: A Little Help needed standing up from a chair using your arms (e.g., wheelchair or bedside chair)?: A Little Help needed to walk in hospital room?: A Little Help needed climbing 3-5 steps with a railing? : A Little 6 Click Score: 18    End of Session Equipment Utilized During Treatment:  Gait belt;Oxygen Activity Tolerance: Patient tolerated treatment well Patient left: in chair;with call bell/phone within reach Nurse Communication: Mobility status PT Visit Diagnosis: Unsteadiness on feet (R26.81);Other abnormalities of gait and mobility (R26.89)    Time: [id number] PT Time Calculation (min) (ACUTE ONLY): 33  min   Charges:   PT Evaluation $PT Eval Low Complexity: 1 Low PT Treatments $Therapeutic Exercise: 8-22 mins        West Carbo, PT, DPT   Acute Rehabilitation Department Pager #: [phone]  Sandra Cockayne 01/21/2022, 5:32 PM

## 2022-01-21 NOTE — Progress Notes (Addendum)
[facility] DAILY ICU PROGRESS NOTE ? ?                 [address] ?           [place] [address] ?         [phone]  ? ?3 Days Post-Op ?Procedure(s) (LRB): ?CORONARY ARTERY BYPASS GRAFTING (CABG), ON PUMP, TIMES FOUR, USING LEFT INTERNAL MAMMARY ARTERY AND RIGHT ENDOSCOPICALLY HARVESTED GREATER SAPHENOUS VEIN (N/A) ?TRANSESOPHAGEAL ECHOCARDIOGRAM (TEE) (N/A) ? ?Total Length of Stay:  LOS: 9 days  ? ?Subjective: ?Awake and alert, pain control adequate.  Walked in the unit  x2 yesterday.  Says he slept a lot yesterday but didn't rest well last night.  ? ?Tolerating p.o.liquids and passing flatus .No BM yet. ? ?Objective: ?Vital signs in last 24 hours: ?Temp:  [98 ?F (36.7 ?C)-98.4 ?F (36.9 ?C)] 98 ?F (36.7 ?C) (03/04 0700) ?Pulse Rate:  [52-66] 56 (03/04 0800) ?Cardiac Rhythm: Atrial paced (03/04 0319) ?Resp:  [15-31] 23 (03/04 0800) ?BP: (104-129)/(63-88) 122/73 (03/04 0800) ?SpO2:  [88 %-98 %] 97 % (03/04 0800) ?Weight:  [97.1 kg] 97.1 kg (03/04 0500) ? ?Filed Weights  ? 01/19/22 0500 01/20/22 0600 01/21/22 0500  ?Weight: 95.9 kg 97.2 kg 97.1 kg  ? ? ?Weight change: -0.1 kg  ?  ? ?Intake/Output from previous day: ?03/03 0701 - 03/04 0700 ?In: 36.7 [I.V.:36.7] ?Out: [id number] [Urine:1050; Chest Tube:20] ? ?Intake/Output this shift: ?No intake/output data recorded. ? ?Current Meds: ?Scheduled Meds: ? acetaminophen  1,000 mg Oral Q6H  ? amiodarone  200 mg Oral BID  ? aspirin EC  325 mg Oral Daily  ? atorvastatin  80 mg Oral Daily  ? bisacodyl  10 mg Oral Daily  ? Or  ? bisacodyl  10 mg Rectal Daily  ? Chlorhexidine Gluconate Cloth  6 each Topical Daily  ? docusate sodium  200 mg Oral Daily  ? enoxaparin (LOVENOX) injection  40 mg Subcutaneous QHS  ? ferrous [id number] C-folic acid  1 capsule Oral TID PC  ? furosemide  20 mg Intravenous BID  ? insulin aspart  0-24 Units Subcutaneous TID AC & HS  ? insulin detemir  12 Units Subcutaneous Daily  ? mouth rinse  15 mL Mouth Rinse BID  ? metoprolol  tartrate  12.5 mg Oral BID  ? pantoprazole  40 mg Oral Daily  ? sodium chloride flush  3 mL Intravenous Q12H  ? ?Continuous Infusions: ? sodium chloride Stopped (01/18/22 2139)  ? sodium chloride Stopped (01/19/22 0725)  ? sodium chloride 20 mL/hr at 01/18/22 1540  ? lactated ringers Stopped (01/18/22 1545)  ? lactated ringers Stopped (01/20/22 0817)  ? phenylephrine (NEO-SYNEPHRINE) Adult infusion Stopped (01/19/22 0731)  ? ?PRN Meds:.sodium chloride, metoprolol tartrate, morphine injection, ondansetron (ZOFRAN) IV, oxyCODONE, sodium chloride flush, traMADol ? ?General appearance: alert, cooperative, and no distress ?Neurologic: intact ?Heart: Sinus rhythm in the high 50's (he normally has a slow heart rate) ?Lungs: Breath sounds clear, diminished.  Chest tubes removed yesterday. CXR showing increased ATX left base.  ?Abdomen: Soft, nontender, few bowel sounds present. ?Extremities: Warm and well-perfused.  The right lower extremity EVH incisions are covered with a dry dressing. LE more edematous today. ?Wound: There is a dry Aquacel dressing over the sternotomy incision. ? ?Lab Results: ?CBC: ?Recent Labs  ?  01/20/22 ?0145 01/21/22 ?[id number]  ?WBC 15.6* 18.9*  ?HGB 8.2* 8.7*  ?HCT 23.8* 25.2*  ?PLT 139* 173  ? ? ?BMET:  ?Recent Labs  ?  01/20/22 ?0145 01/21/22 ?[id number]  ?NA 132* 129*  ?K 3.7 4.6  ?CL 97* 94*  ?CO2 27 26  ?GLUCOSE 126* 112*  ?BUN 15 21*  ?CREATININE 1.12 1.13  ?CALCIUM 7.3* 7.3*  ? ?  ?CMET: ?Lab Results  ?Component Value Date  ? WBC 18.9 (H) 01/21/2022  ? HGB 8.7 (L) 01/21/2022  ? HCT 25.2 (L) 01/21/2022  ? PLT 173 01/21/2022  ? GLUCOSE 112 (H) 01/21/2022  ? CHOL 192 01/13/2022  ? TRIG 286 (H) 01/13/2022  ? HDL 38 (L) 01/13/2022  ? LDLCALC 100 (H) 01/13/2022  ? ALT 133 (H) 01/18/2022  ? AST 79 (H) 01/18/2022  ? NA 129 (L) 01/21/2022  ? K 4.6 01/21/2022  ? CL 94 (L) 01/21/2022  ? CREATININE 1.13 01/21/2022  ? BUN 21 (H) 01/21/2022  ? CO2 26 01/21/2022  ? TSH 6.308 (H) 01/17/2022  ? INR 1.4 (H) 01/18/2022   ? HGBA1C 5.2 01/12/2022  ? ? ? ? ?PT/INR:  ?Recent Labs  ?  01/18/22 ?1553  ?LABPROT 16.7*  ?INR 1.4*  ? ? ?Radiology: DG CHEST PORT 1 VIEW ? ?Result Date: 01/21/2022 ?CLINICAL DATA:  Status post CABG. EXAM: PORTABLE CHEST 1 VIEW COMPARISON:  01/20/2022 FINDINGS: Right IJ catheter tip is in the projection of the SVC. Previous median sternotomy and CABG procedure. Stable cardiomediastinal contours. Bilateral chest tubes and the mediastinal drain have been removed. No pneumothorax visualized. Subsegmental atelectasis noted within the left upper lobe. Unchanged left lower lobe atelectasis/consolidation compared with previous exam. IMPRESSION: 1. No pneumothorax status post chest tube removal. 2. Persistent left lower lobe atelectasis/consolidation. Electronically Signed   By: Kerby Moors M.D.   On: 01/21/2022 08:35   ? ? ?Assessment/Plan: ?S/P Procedure(s) (LRB): ?CORONARY ARTERY BYPASS GRAFTING (CABG), ON PUMP, TIMES FOUR, USING LEFT INTERNAL MAMMARY ARTERY AND RIGHT ENDOSCOPICALLY HARVESTED GREATER SAPHENOUS VEIN (N/A) ?TRANSESOPHAGEAL ECHOCARDIOGRAM (TEE) (N/A) ? ?-Postop day 3 CABG x4 after presenting with out of hospital cardiac arrest.  Preop ejection fraction 55 to 60%.  Stable cardiac rhythm and hemodynamics.   On ASA and statin. On oral amiodarone for cardiac arrest prior to admission. Will hold the metoprolol since his HR is normally in the 50's  ? ?-Heme-expected acute blood loss anemia.  Hematocrit 25% and trending up.  He seems to be tolerating this reasonably well.  Monitor. ? ?-Pulm-CXR showing persistent basilar ATX and he is requiring 4L O2 to maintain sats.  Needs more work on pulmonary hygiene. ? ?-GI-abdomen benign.  Few bowel sounds.  Passing gas but no BM yet.  Diet as tolerated. Miralax today. ? ?-Renal-baseline creatinine ~1 and is stable.    Weight is about 8 kg positive.  Needs more diuresis. ? ?-Neuro-intact ? ?-DVT PPx-continue enoxaparin daily ? ? ?Antony Odea,  PA-C ?[phone] ?01/21/2022 9:02 AM ? ? Chart reviewed, patient examined, agree with above. ?He is doing well overall for POD 3. Still needs more diuresis. ? ?

## 2022-01-21 NOTE — Progress Notes (Signed)
Pt admitted to rm 12 from [facility]. CHG wipe given. Initiated tele. VSS. Oriented pt to the unit. Call bell within reach.  ? ?Lawson Radar, RN ? ?

## 2022-01-22 ENCOUNTER — Inpatient Hospital Stay (HOSPITAL_COMMUNITY): Payer: 59

## 2022-01-22 LAB — GLUCOSE, CAPILLARY
Glucose-Capillary: 92 mg/dL (ref 70–99)
Glucose-Capillary: 137 mg/dL — ABNORMAL HIGH (ref 70–99)
Glucose-Capillary: 125 mg/dL — ABNORMAL HIGH (ref 70–99)
Glucose-Capillary: 130 mg/dL — ABNORMAL HIGH (ref 70–99)

## 2022-01-22 LAB — CBC
HCT: 23.8 % — ABNORMAL LOW (ref 39.0–52.0)
Hemoglobin: 8.1 g/dL — ABNORMAL LOW (ref 13.0–17.0)
MCH: 30.2 pg (ref 26.0–34.0)
MCHC: 34 g/dL (ref 30.0–36.0)
MCV: 88.8 fL (ref 80.0–100.0)
Platelets: 237 10*3/uL (ref 150–400)
RBC: 2.68 MIL/uL — ABNORMAL LOW (ref 4.22–5.81)
RDW: 13.4 % (ref 11.5–15.5)
WBC: 15.1 10*3/uL — ABNORMAL HIGH (ref 4.0–10.5)
nRBC: 0.7 % — ABNORMAL HIGH (ref 0.0–0.2)

## 2022-01-22 LAB — BASIC METABOLIC PANEL
Anion gap: 7 (ref 5–15)
BUN: 20 mg/dL (ref 6–20)
CO2: 30 mmol/L (ref 22–32)
Calcium: 7.2 mg/dL — ABNORMAL LOW (ref 8.9–10.3)
Chloride: 94 mmol/L — ABNORMAL LOW (ref 98–111)
Creatinine, Ser: 1.26 mg/dL — ABNORMAL HIGH (ref 0.61–1.24)
GFR, Estimated: >60 mL/min (ref >60)
Glucose, Bld: 83 mg/dL (ref 70–99)
Potassium: 4.3 mmol/L (ref 3.5–5.1)
Sodium: 131 mmol/L — ABNORMAL LOW (ref 135–145)

## 2022-01-22 MED ORDER — ZOLPIDEM TARTRATE 5 MG PO TABS: 10.0000 mg | ORAL_TABLET | Freq: Every evening | ORAL | Status: DC | PRN

## 2022-01-22 MED ORDER — LACTULOSE 10 GM/15ML PO SOLN
30.0000 g | Freq: Once | ORAL | Status: AC
Start: 2022-01-22 — End: 2022-01-22
  Administered 2022-01-22: 30 g via ORAL
  Filled 2022-01-22: qty 45
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
## 2022-01-22 NOTE — Discharge Instructions (Signed)
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
## 2022-01-22 NOTE — Progress Notes (Addendum)
TCTS DAILY ICU PROGRESS NOTE ? ?                 [address] ?           [place] [address] ?         [phone]  ? ?4 Days Post-Op ?Procedure(s) (LRB): ?CORONARY ARTERY BYPASS GRAFTING (CABG), ON PUMP, TIMES FOUR, USING LEFT INTERNAL MAMMARY ARTERY AND RIGHT ENDOSCOPICALLY HARVESTED GREATER SAPHENOUS VEIN (N/A) ?TRANSESOPHAGEAL ECHOCARDIOGRAM (TEE) (N/A) ? ?Total Length of Stay:  LOS: 10 days  ? ?Subjective: ?Transferred from [place] to 4E yesterday.  ?Awake and alert, eating breakfast.  Progressing with mobility. Still not sleeping well at night.  ?Good response to diuresis with net 3L output yesterday.  ?No BM yet. ? ?Objective: ?Vital signs in last 24 hours: ?Temp:  [96.6 ?F (35.9 ?C)-98.2 ?F (36.8 ?C)] 98.2 ?F (36.8 ?C) (03/05 0831) ?Pulse Rate:  [60-72] 69 (03/05 0831) ?Cardiac Rhythm: Normal sinus rhythm (03/04 1918) ?Resp:  [15-20] 20 (03/05 0831) ?BP: (111-150)/(66-86) 137/82 (03/05 0831) ?SpO2:  [93 %-96 %] 96 % (03/05 0831) ?Weight:  [94.1 kg] 94.1 kg (03/05 [id number]) ? ?Filed Weights  ? 01/20/22 0600 01/21/22 0500 01/22/22 0633  ?Weight: 97.2 kg 97.1 kg 94.1 kg  ? ? ?Weight change: -3 kg  ?  ? ?Intake/Output from previous day: ?03/04 0701 - 03/05 0700 ?In: 173 [P.O.:170; I.V.:3] ?Out: 3115 [Urine:3115] ? ?Intake/Output this shift: ?No intake/output data recorded. ? ?Current Meds: ?Scheduled Meds: ? amiodarone  200 mg Oral BID  ? atorvastatin  80 mg Oral Daily  ? docusate sodium  200 mg Oral Daily  ? ferrous Q000111Q C-folic acid  1 capsule Oral TID PC  ? insulin aspart  0-24 Units Subcutaneous TID AC & HS  ? insulin detemir  12 Units Subcutaneous Daily  ? pantoprazole  40 mg Oral QAC breakfast  ? potassium chloride  20 mEq Oral BID  ? sodium chloride flush  3 mL Intravenous Q12H  ? torsemide  20 mg Oral Daily  ? ?Continuous Infusions: ? sodium chloride    ? ?PRN Meds:.sodium chloride, acetaminophen, alum & mag hydroxide-simeth, magnesium hydroxide, oxyCODONE, prochlorperazine,  sodium chloride flush, traMADol ? ?General appearance: alert, cooperative, and no distress ?Neurologic: intact ?Heart: Sinus rhythm in the high 50's (he normally has a slow heart rate) ?Lungs: Breath sounds clear, diminished.  On 4L [place] O2. CXR showing small left effusion with LLL ATX / volume loss.  ?Abdomen: Soft, nontender, few bowel sounds present. ?Extremities: Warm and well-perfused.  The right lower extremity EVH incisions are covered with a dry dressing. LE edema is improving ?Wound: the sternotomy incision is dry and intact. ? ?Lab Results: ?CBC: ?Recent Labs  ?  01/21/22 ?0607 01/22/22 ?0255  ?WBC 18.9* 15.1*  ?HGB 8.7* 8.1*  ?HCT 25.2* 23.8*  ?PLT 173 237  ? ? ?BMET:  ?Recent Labs  ?  01/21/22 ?0607 01/22/22 ?0255  ?NA 129* 131*  ?K 4.6 4.3  ?CL 94* 94*  ?CO2 26 30  ?GLUCOSE 112* 83  ?BUN 21* 20  ?CREATININE 1.13 1.26*  ?CALCIUM 7.3* 7.2*  ? ?  ?CMET: ?Lab Results  ?Component Value Date  ? WBC 15.1 (H) 01/22/2022  ? HGB 8.1 (L) 01/22/2022  ? HCT 23.8 (L) 01/22/2022  ? PLT 237 01/22/2022  ? GLUCOSE 83 01/22/2022  ? CHOL 192 01/13/2022  ? TRIG 286 (H) 01/13/2022  ? HDL 38 (L) 01/13/2022  ? LDLCALC 100 (H) 01/13/2022  ? ALT 133 (  H) 01/18/2022  ? AST 79 (H) 01/18/2022  ? NA 131 (L) 01/22/2022  ? K 4.3 01/22/2022  ? CL 94 (L) 01/22/2022  ? CREATININE 1.26 (H) 01/22/2022  ? BUN 20 01/22/2022  ? CO2 30 01/22/2022  ? TSH 6.308 (H) 01/17/2022  ? INR 1.4 (H) 01/18/2022  ? HGBA1C 5.2 01/12/2022  ? ? ? ?Radiology: ? ? CLINICAL DATA:  Status post CABG on 01/18/2022. ?  ?EXAM: ?CHEST - 2 VIEW ?  ?COMPARISON:  01/21/2022 ?  ?FINDINGS: ?Interval removal of right IJ Cordis. Previous median sternotomy and ?CABG procedure. Right lung is clear. Unchanged left pleural effusion ?with decreased aeration to the left lower lung. Linear atelectasis ?noted in the left upper lobe, unchanged. ?  ?IMPRESSION: ?Persistent left pleural effusion with decreased aeration to the left ?lower lung. ?  ?  ?Electronically Signed ?  By: Kerby Moors M.D. ?  On: 01/22/2022 08:36 ? ? ?Assessment/Plan: ?S/P Procedure(s) (LRB): ?CORONARY ARTERY BYPASS GRAFTING (CABG), ON PUMP, TIMES FOUR, USING LEFT INTERNAL MAMMARY ARTERY AND RIGHT ENDOSCOPICALLY HARVESTED GREATER SAPHENOUS VEIN (N/A) ?TRANSESOPHAGEAL ECHOCARDIOGRAM (TEE) (N/A) ? ?-Postop day 4 CABG x4 after presenting with out of hospital cardiac arrest.  Preop ejection fraction 55 to 60%.  BB held due to bradycardia. HR has improved to the 70's    On ASA and statin. On oral amiodarone for cardiac arrest prior to admission.  ? ?-Heme-expected acute blood loss anemia.  Hematocrit 24% and stable.  He seems to be tolerating this reasonably well.  Monitor. ? ?-Pulm-CXR showing small left effusion and basilar ATX.   Continue diuresis and pulmonary hygiene. ? ?-GI-abdomen benign.  Few bowel sounds.  Passing gas but no BM yet.  Diet as tolerated. Miralax today. ? ?-Renal-baseline creatinine ~1, increased to 1.3.    Weight is about  ?6 kg positive.  Continue diuresis, watch creat.  ? ?-Neuro-intact ? ?-DVT PPx-continue enoxaparin daily ? ?-Insomnia- He has not had any benefit from melatonin in the past.  Will give Ambien while in the hospital but will not plan discharge on this medication.  ? ? ?Antony Odea, PA-C ?[phone] ?01/22/2022 9:14 AM ? ? ? Chart reviewed, patient examined, agree with above. ?He still needs some diuresis but progressing well overall. ?

## 2022-01-23 LAB — CBC
HCT: 25.1 % — ABNORMAL LOW (ref 39.0–52.0)
Hemoglobin: 8.4 g/dL — ABNORMAL LOW (ref 13.0–17.0)
MCH: 30.1 pg (ref 26.0–34.0)
MCHC: 33.5 g/dL (ref 30.0–36.0)
MCV: 90 fL (ref 80.0–100.0)
Platelets: 307 10*3/uL (ref 150–400)
RBC: 2.79 MIL/uL — ABNORMAL LOW (ref 4.22–5.81)
RDW: 13.3 % (ref 11.5–15.5)
WBC: 15.4 10*3/uL — ABNORMAL HIGH (ref 4.0–10.5)
nRBC: 0.6 % — ABNORMAL HIGH (ref 0.0–0.2)

## 2022-01-23 LAB — BASIC METABOLIC PANEL
Anion gap: 7 (ref 5–15)
BUN: 22 mg/dL — ABNORMAL HIGH (ref 6–20)
CO2: 30 mmol/L (ref 22–32)
Calcium: 7.6 mg/dL — ABNORMAL LOW (ref 8.9–10.3)
Chloride: 95 mmol/L — ABNORMAL LOW (ref 98–111)
Creatinine, Ser: 1.2 mg/dL (ref 0.61–1.24)
GFR, Estimated: >60 mL/min (ref >60)
Glucose, Bld: 109 mg/dL — ABNORMAL HIGH (ref 70–99)
Potassium: 4.2 mmol/L (ref 3.5–5.1)
Sodium: 132 mmol/L — ABNORMAL LOW (ref 135–145)

## 2022-01-23 LAB — GLUCOSE, CAPILLARY
Glucose-Capillary: 98 mg/dL (ref 70–99)
Glucose-Capillary: 95 mg/dL (ref 70–99)

## 2022-01-23 MED ORDER — TRAZODONE HCL 50 MG PO TABS
50.0000 mg | ORAL_TABLET | Freq: Every day | ORAL | Status: DC
  Administered 2022-01-23 – 2022-01-25 (×3): 50 mg via ORAL
  Filled 2022-01-23 (×3): qty 1

## 2022-01-23 MED ORDER — ENOXAPARIN SODIUM 30 MG/0.3ML IJ SOSY
30.0000 mg | PREFILLED_SYRINGE | INTRAMUSCULAR | Status: DC
  Administered 2022-01-23 – 2022-01-26 (×4): 30 mg via SUBCUTANEOUS
  Filled 2022-01-23 (×4): qty 0.3

## 2022-01-23 MED ORDER — DIPHENHYDRAMINE HCL 25 MG PO CAPS: 25.0000 mg | ORAL_CAPSULE | Freq: Every evening | ORAL | Status: DC | PRN

## 2022-01-23 MED ORDER — ASPIRIN EC 81 MG PO TBEC
81.0000 mg | DELAYED_RELEASE_TABLET | Freq: Every day | ORAL | Status: DC
Start: 2022-01-23 — End: 2022-01-26
  Administered 2022-01-23 – 2022-01-26 (×4): 81 mg via ORAL
  Filled 2022-01-23 (×4): qty 1

## 2022-01-23 MED ORDER — LOSARTAN POTASSIUM 25 MG PO TABS
25.0000 mg | ORAL_TABLET | Freq: Every day | ORAL | Status: DC
Start: 2022-01-23 — End: 2022-01-26
  Administered 2022-01-23 – 2022-01-26 (×4): 25 mg via ORAL
  Filled 2022-01-23 (×4): qty 1

## 2022-01-23 NOTE — Progress Notes (Signed)
PT Cancellation Note ? ?Patient Details ?Name: Jeremy Winters ?MRN: [id number] ?DOB: Jan 24, 1966 ? ? ?Cancelled Treatment:    Reason Eval/Treat Not Completed: Patient declined, no reason specified Pt requesting to rest; has been for 2 walks so far today. ? ?Lillia Pauls, PT, DPT ?Acute Rehabilitation Services ?Pager [phone] ?Office [phone] ? ? ? ?Jeremy Winters ?01/23/2022, 3:26 PM ?

## 2022-01-23 NOTE — Progress Notes (Signed)
EPW discontinued as per orders.  Pt tolerated well, bedrest until 1215. ?

## 2022-01-23 NOTE — Progress Notes (Signed)
Mobility Specialist Progress Note ? ? 01/23/22 1606  ?Mobility  ?Activity Refused mobility  ? ?Pt requesting to rest for the remainder of the day. Will f/u tomorrow if time permits. ? ?Jeremy Winters ?Mobility Specialist ?Phone Number [phone] ? ?

## 2022-01-23 NOTE — Progress Notes (Signed)
CARDIAC REHAB PHASE I  ? ?PRE:  Rate/Rhythm: 76 SR ? ?  BP: sitting 148/82 ? ?  SaO2: 92 2L ? ?MODE:  Ambulation: 160 ft  ? ?POST:  Rate/Rhythm: 86 SR ? ?  BP: sitting 174/88  ? ?  SaO2: 93 4L ? ?Pt stood with rocking, walked with standby assist and RW. Pt immediately diaphoretic, which he sts has been normal since surgery. Paused outside of room and pt had LOB with assisted recovery. Flat affect but denied significant dizziness, just c/o fatigue. After 80 ft SaO2 85 2L. Needed 4L to increase to 93. Ambulated back to room, pt sts he feels better on 4L than 2L. To chair in front of sink for bath. Encouraged IS. He is motivated to walk, frustrated that he didn't walk more yesterday.  ?[phone]  ? ?Tyeesha Riker Ethelda Chick CES, ACSM ?01/23/2022 ?8:44 AM ? ? ? ? ?

## 2022-01-23 NOTE — Progress Notes (Addendum)
? ?   ?[address] ?      [place] [address] ?            [phone]   ? ?  ? ?5 Days Post-Op Procedure(s) (LRB): ?CORONARY ARTERY BYPASS GRAFTING (CABG), ON PUMP, TIMES FOUR, USING LEFT INTERNAL MAMMARY ARTERY AND RIGHT ENDOSCOPICALLY HARVESTED GREATER SAPHENOUS VEIN (N/A) ?TRANSESOPHAGEAL ECHOCARDIOGRAM (TEE) (N/A) ? ?Subjective: ? ?Patient isn't sleeping.  The patient stated melatonin doesn't work well for him.  He is refusing Remus Loffler as he states he has heard too many horror stories.  Significant other in the room thinks he needs Valium due to anxiety component.  He has moved his bowels and denies nausea/vomiting.   ? ?Objective: ?Vital signs in last 24 hours: ?Temp:  [97.5 ?F (36.4 ?C)-98.2 ?F (36.8 ?C)] 98.2 ?F (36.8 ?C) (03/06 3606) ?Pulse Rate:  [61-76] 70 (03/06 0727) ?Cardiac Rhythm: Normal sinus rhythm (03/05 1918) ?Resp:  [15-25] 20 (03/06 0727) ?BP: (117-152)/(66-82) 148/82 (03/06 0727) ?SpO2:  [93 %-100 %] 96 % (03/06 0727) ?Weight:  [94 kg] 94 kg (03/06 0500) ? ?Intake/Output from previous day: ?03/05 0701 - 03/06 0700 ?In: 567 [P.O.:564; I.V.:3] ?Out: 875 [Urine:875] ?Intake/Output this shift: ?Total I/O ?In: -  ?Out: 800 [Urine:800] ? ?General appearance: alert, cooperative, and no distress ?Heart: regular rate and rhythm ?Lungs: clear to auscultation bilaterally ?Abdomen: soft, non-tender; bowel sounds normal; no masses,  no organomegaly ?Extremities: edema pitting mild ?Wound: clean and dry ? ?Lab Results: ?Recent Labs  ?  01/22/22 ?0255 01/23/22 ?0131  ?WBC 15.1* 15.4*  ?HGB 8.1* 8.4*  ?HCT 23.8* 25.1*  ?PLT 237 307  ? ?BMET:  ?Recent Labs  ?  01/22/22 ?0255 01/23/22 ?0131  ?NA 131* 132*  ?K 4.3 4.2  ?CL 94* 95*  ?CO2 30 30  ?GLUCOSE 83 109*  ?BUN 20 22*  ?CREATININE 1.26* 1.20  ?CALCIUM 7.2* 7.6*  ?  ?PT/INR: No results for input(s): LABPROT, INR in the last 72 hours. ?ABG ?   ?Component Value Date/Time  ? PHART 7.466 (H) 01/19/2022 0309  ? HCO3 24.7 01/19/2022 0309  ? TCO2  26 01/19/2022 0309  ? ACIDBASEDEF 1.0 01/18/2022 1841  ? O2SAT 95 01/19/2022 0309  ? ?CBG (last 3)  ?Recent Labs  ?  01/22/22 ?1722 01/22/22 ?2129 01/23/22 ?7703  ?GLUCAP 125* 130* 98  ? ? ?Assessment/Plan: ?S/P Procedure(s) (LRB): ?CORONARY ARTERY BYPASS GRAFTING (CABG), ON PUMP, TIMES FOUR, USING LEFT INTERNAL MAMMARY ARTERY AND RIGHT ENDOSCOPICALLY HARVESTED GREATER SAPHENOUS VEIN (N/A) ?TRANSESOPHAGEAL ECHOCARDIOGRAM (TEE) (N/A) ? ?CV- NSR, SBP is increasing up to 140s- will add low dose Cozaar, d/c EPW ?Pulm- wean oxygen as tolerated, CXR with left pleural effusion, will reassess tomorrow to see if diuretics are helping, doesn't appear to need Thoracentesis as this time ?Renal- creatinine improved today at 1.20, K is at 4.2- on Demadex at 20, will add TED hose ?Hyponatremia- due to diuretics will follow ?Insomnia- patient not sleeping, melatonin doesn't work for patient, refusing Remus Loffler due to horror stories patient has heard about it.. S/O thinks valium would be a good option.Marland Kitchen spoke with pharmacist and we will try Trazadone with its shorter duration and less likely to cause dizziness, unsteadiness with ambulation ?CBGs- not a diabetic will d/c SSIP ?Dispo- patient stable, add low dose Cozaar for additional BP control, d/c EPW today, watch NA level with diuretics, try Trazadone for sleep ? ? LOS: 11 days  ? ?Lowella Dandy, PA-C ?01/23/2022 ? ?Patient seen and examined, agree with above ?  Just back from walk, still on O2, CXR with left effusion/ atelectasis, still has 2+ peripheral edema- diuresis ? ?Salvatore Decent Dorris Fetch, MD ?[facility] ?[phone] ? ? ?

## 2022-01-24 MED ORDER — METOLAZONE 5 MG PO TABS
5.0000 mg | ORAL_TABLET | Freq: Once | ORAL | Status: AC
  Administered 2022-01-24: 5 mg via ORAL
  Filled 2022-01-24: qty 1

## 2022-01-24 NOTE — Progress Notes (Signed)
Physical Therapy Treatment ?Patient Details ?Name: Jeremy Winters ?MRN: [id number] ?DOB: 06-16-1966 ?Today's Date: 01/24/2022 ? ? ?History of Present Illness The pt is a 56 yo male presenting 2/23 after cardiac arrest while at the gym. Pt required x2 Epi, x3 shocks, and 18 min CPR, found to have STEMI with Vfib/vtach arrest. S/p L heart cath 2/23, CABG x4 on 3/1. ? ?  ?PT Comments  ? ? Pt received up in hallway with spouse present, pt agreeable to therapy session with emphasis on supine/seated/standing LE exercises for strengthening. Discussed stair sequencing with pt and spouse, he was fatigued after walking ~56ft but agreeable to perform stairs in AM prior to DC, he will go to spouse's home upon DC with flight of stairs to bedroom, but is also able to sleep on main floor in recliner chair if needed. Pt modI for transfers and supervision at most for gait. Pt pulling 1000-1250 on IS, reports hourly compliance. Pt continues to benefit from PT services to progress toward functional mobility goals.    ?Recommendations for follow up therapy are one component of a multi-disciplinary discharge planning process, led by the attending physician.  Recommendations may be updated based on patient status, additional functional criteria and insurance authorization. ? ?Follow Up Recommendations ? No PT follow up (phase 2 cardiac rehab, OPPT if unable to get into cardiac rehab) ?  ?  ?Assistance Recommended at Discharge Intermittent Supervision/Assistance  ?Patient can return home with the following A little help with walking and/or transfers;A little help with bathing/dressing/bathroom;Assistance with cooking/housework;Assist for transportation;Help with stairs or ramp for entrance ?  ?Equipment Recommendations ? Rolling walker (2 wheels);BSC/3in1 (pending progress; may progress to no RW)  ?  ?Recommendations for Other Services   ? ? ?  ?Precautions / Restrictions Precautions ?Precautions: Sternal;Fall ?Precaution Booklet Issued:  Yes (comment) ?Precaution Comments: Watch SpO2, on 2L for mobility ?Restrictions ?Weight Bearing Restrictions: No  ?  ? ?Mobility ? Bed Mobility ?Overal bed mobility: Needs Assistance ?  ?  ?  ?  ?  ?  ?General bed mobility comments: pt sitting up EOB at end of session, spouse present to assist him ?  ? ?Transfers ?Overall transfer level: Needs assistance ?Equipment used: None ?Transfers: Sit to/from Stand ?Sit to Stand: Supervision ?  ?  ?  ?  ?  ?General transfer comment: stand>sit, no AD ?  ? ?Ambulation/Gait ?Ambulation/Gait assistance: Supervision ?Gait Distance (Feet): 250 Feet (+approx 26ft prior to session beginning) ?Assistive device: None ?Gait Pattern/deviations: Step-through pattern, Decreased stride length ?  ?  ?  ?General Gait Details: SpO2 on 2L when received reading 89-92%; SpO2 desat to 87% at end of trial and took ~2 minutes to return to >90% on 2L O2 [place] after seated break; with use of IS, SpO2 >90%. No LOB, mostly cues for activity pacing/pursed-lip breathing ? ? ?Stairs ?Stairs:  (verbal review for safe technique, plan to assess next session per spouse her house has flight of steps to bedroom) ?  ?  ?  ?  ? ? ?Wheelchair Mobility ?  ? ?Modified Rankin (Stroke Patients Only) ?  ? ? ?  ?Balance Overall balance assessment: Needs assistance ?Sitting-balance support: No upper extremity supported, Feet supported ?Sitting balance-Leahy Scale: Good ?  ?  ?Standing balance support: Bilateral upper extremity supported, During functional activity ?Standing balance-Leahy Scale: Fair ?Standing balance comment: no UE support no LOB but stiff upright posture/soft guarding noted. ?  ?  ?  ?  ?  ?  ?  ?  ?  ?  ?  ?  ? ?  ?  Cognition Arousal/Alertness: Awake/alert ?Behavior During Therapy: Flat affect ?Overall Cognitive Status: Impaired/Different from baseline ?Area of Impairment: Memory, Awareness, Problem solving, Attention ?  ?  ?  ?  ?  ?  ?  ?  ?  ?Current Attention Level: Selective ?Memory: Decreased  short-term memory ?  ?  ?Awareness: Emergent ?Problem Solving: Slow processing ?General Comments: Flat affect continues but appropriate in conversation and following 1 and 2-step cues well. Spouse tends to answer questions on his behalf. Pt compliant with Move in the Tube precautions and also included handout in HEP program to reinforce. ?  ?  ? ?  ?Exercises Other Exercises ?Other Exercises: incentive spirometer x10 - able to pull 1000-1250 ?Other Exercises: gait billed as TE for BLE strengthening ?Other Exercises: HEP handout reviewed with pt, link: [email or web address] Access Code: [email or web address] ? ?  ?General Comments General comments (skin integrity, edema, etc.): see comments under gait for VS ?  ?  ? ?Pertinent Vitals/Pain Pain Assessment ?Pain Assessment: Faces ?Faces Pain Scale: Hurts a little bit ?Pain Location: incisional discomfort ?Pain Descriptors / Indicators: Sore ?Pain Intervention(s): Monitored during session, Repositioned  ? ? ?Home Living   ?  ?  ?  ?  ?  ?  ?  ?  ?  ?   ?  ?Prior Function    ?  ?  ?   ? ?PT Goals (current goals can now be found in the care plan section) Acute Rehab PT Goals ?Patient Stated Goal: return to exercising and work ?PT Goal Formulation: With patient ?Time For Goal Achievement: 02/04/22 ?Progress towards PT goals: Progressing toward goals ? ?  ?Frequency ? ? ? Min 3X/week ? ? ? ?  ?PT Plan Current plan remains appropriate  ? ? ?Co-evaluation   ?  ?  ?  ?  ? ?  ?AM-PAC PT "6 Clicks" Mobility   ?Outcome Measure ? Help needed turning from your back to your side while in a flat bed without using bedrails?: None ?Help needed moving from lying on your back to sitting on the side of a flat bed without using bedrails?: A Little ?Help needed moving to and from a bed to a chair (including a wheelchair)?: None ?Help needed standing up from a chair using your arms (e.g., wheelchair or bedside chair)?: None ?Help needed to walk in hospital room?: A Little ?Help needed climbing 3-5  steps with a railing? : A Little ?6 Click Score: 21 ? ?  ?End of Session Equipment Utilized During Treatment: Oxygen ?Activity Tolerance: Patient tolerated treatment well ?Patient left: in bed;with call bell/phone within reach;with family/visitor present (pt seated EOB, spouse present) ?Nurse Communication: Mobility status ?PT Visit Diagnosis: Unsteadiness on feet (R26.81);Other abnormalities of gait and mobility (R26.89) ?  ? ? ?Time: [id number] ?PT Time Calculation (min) (ACUTE ONLY): 18 min ? ?Charges:  $Therapeutic Exercise: 8-22 mins          ?          ? ?Maki Sweetser P., PTA ?Acute Rehabilitation Services ?Pager: [phone] ?Office: [phone]  ? ? ?Kara Pacer Beuna Bolding ?01/24/2022, 4:07 PM ? ?

## 2022-01-24 NOTE — Progress Notes (Signed)
CARDIAC REHAB PHASE I  ? ?PRE:  Rate/Rhythm: 76 SR ? ?  BP: sitting 126/67 ? ?  SaO2: 94 2L ? ?MODE:  Ambulation: 470 ft  ? ?POST:  Rate/Rhythm: 93 SR ? ?  BP: sitting 139/87  ? ?  SaO2: 91 3L, brief 87 3L ? ?Pt moved out of bed from raised [place] position following sternal precautions. Ambulated without RW albeit stiff stance specifically with arms held at chest. Walked with 3L and able to keep SaO2 up 90-92 3L while practicing pursed lip breathing. Had brief dip to 87 3L after walk when he lost focus on breathing. To EOB. Practiced IS, 1100 ml. Needed reminders for slow inspiration. Wife supportive, left pt on EOB. ?[id number]  ? ?[place], ACSM ?01/24/2022 ?12:11 PM ? ? ? ? ?

## 2022-01-24 NOTE — Progress Notes (Addendum)
TCTS DAILY ICU PROGRESS NOTE ? ?                 [address] ?           [place] [address] ?         [phone]  ? ?6 Days Post-Op ?Procedure(s) (LRB): ?CORONARY ARTERY BYPASS GRAFTING (CABG), ON PUMP, TIMES FOUR, USING LEFT INTERNAL MAMMARY ARTERY AND RIGHT ENDOSCOPICALLY HARVESTED GREATER SAPHENOUS VEIN (N/A) ?TRANSESOPHAGEAL ECHOCARDIOGRAM (TEE) (N/A) ? ?Total Length of Stay:  LOS: 12 days  ? ?Subjective: ? ?Awake and alert, says he slept better last night with the Trazadone. ?Walked in the hall a couple of times.  ?BM 2 days ago.  ? ?Objective: ?Vital signs in last 24 hours: ?Temp:  [97.4 ?F (36.3 ?C)-98.3 ?F (36.8 ?C)] 98 ?F (36.7 ?C) (03/07 0800) ?Pulse Rate:  [68-77] 69 (03/07 0800) ?Cardiac Rhythm: Normal sinus rhythm (03/07 0748) ?Resp:  [16-25] 17 (03/07 0800) ?BP: (108-169)/(69-96) 132/71 (03/07 0800) ?SpO2:  [90 %-95 %] 93 % (03/07 0800) ?Weight:  [95.3 kg] 95.3 kg (03/07 0500) ? ?Filed Weights  ? 01/22/22 [id number] 01/23/22 0500 01/24/22 0500  ?Weight: 94.1 kg 94 kg 95.3 kg  ? ? ?Weight change: 1.255 kg  ?  ? ?Intake/Output from previous day: ?03/06 0701 - 03/07 0700 ?In: 41 [P.O.:420] ?Out: 1700 [Urine:1700] ? ?Intake/Output this shift: ?Total I/O ?In: 180 [P.O.:180] ?Out: 300 [Urine:300] ? ?Current Meds: ?Scheduled Meds: ? amiodarone  200 mg Oral BID  ? aspirin EC  81 mg Oral Daily  ? atorvastatin  80 mg Oral Daily  ? docusate sodium  200 mg Oral Daily  ? enoxaparin (LOVENOX) injection  30 mg Subcutaneous Q24H  ? ferrous Q000111Q C-folic acid  1 capsule Oral TID PC  ? losartan  25 mg Oral Daily  ? pantoprazole  40 mg Oral QAC breakfast  ? potassium chloride  20 mEq Oral BID  ? sodium chloride flush  3 mL Intravenous Q12H  ? torsemide  20 mg Oral Daily  ? traZODone  50 mg Oral QHS  ? ?Continuous Infusions: ? sodium chloride    ? ?PRN Meds:.sodium chloride, acetaminophen, alum & mag hydroxide-simeth, magnesium hydroxide, oxyCODONE, prochlorperazine, sodium chloride flush,  traMADol ? ?General appearance: alert, cooperative, and no distress ?Neurologic: intact ?Heart: Sinus rhythm 60-70/min.   ?Lungs: Breath sounds clear, diminished.  On 2L [place] O2. CXR showing small left effusion with LLL ATX / volume loss.  ?Abdomen: Soft, nontender, few bowel sounds present. ?Extremities: Warm and well-perfused.  The right lower extremity EVH incision  in intact and dry. LE edema is improving ?Wound: the sternotomy incision is dry and intact. ? ?Lab Results: ?CBC: ?Recent Labs  ?  01/22/22 ?0255 01/23/22 ?0131  ?WBC 15.1* 15.4*  ?HGB 8.1* 8.4*  ?HCT 23.8* 25.1*  ?PLT 237 307  ? ? ?BMET:  ?Recent Labs  ?  01/22/22 ?0255 01/23/22 ?0131  ?NA 131* 132*  ?K 4.3 4.2  ?CL 94* 95*  ?CO2 30 30  ?GLUCOSE 83 109*  ?BUN 20 22*  ?CREATININE 1.26* 1.20  ?CALCIUM 7.2* 7.6*  ? ?  ?CMET: ?Lab Results  ?Component Value Date  ? WBC 15.4 (H) 01/23/2022  ? HGB 8.4 (L) 01/23/2022  ? HCT 25.1 (L) 01/23/2022  ? PLT 307 01/23/2022  ? GLUCOSE 109 (H) 01/23/2022  ? CHOL 192 01/13/2022  ? TRIG 286 (H) 01/13/2022  ? HDL 38 (L) 01/13/2022  ? LDLCALC 100 (H) 01/13/2022  ? ALT 133 (  H) 01/18/2022  ? AST 79 (H) 01/18/2022  ? NA 132 (L) 01/23/2022  ? K 4.2 01/23/2022  ? CL 95 (L) 01/23/2022  ? CREATININE 1.20 01/23/2022  ? BUN 22 (H) 01/23/2022  ? CO2 30 01/23/2022  ? TSH 6.308 (H) 01/17/2022  ? INR 1.4 (H) 01/18/2022  ? HGBA1C 5.2 01/12/2022  ? ?No results for input(s): LABPROT, INR in the last 72 hours. ? ?Radiology: ? ? CLINICAL DATA:  Status post CABG on 01/18/2022. ?  ?EXAM: ?CHEST - 2 VIEW ?  ?COMPARISON:  01/21/2022 ?  ?FINDINGS: ?Interval removal of right IJ Cordis. Previous median sternotomy and ?CABG procedure. Right lung is clear. Unchanged left pleural effusion ?with decreased aeration to the left lower lung. Linear atelectasis ?noted in the left upper lobe, unchanged. ?  ?IMPRESSION: ?Persistent left pleural effusion with decreased aeration to the left ?lower lung. ?  ?  ?Electronically Signed ?  By: Kerby Moors M.D. ?   On: 01/22/2022 08:36 ? ? ?Assessment/Plan: ?S/P Procedure(s) (LRB): ?CORONARY ARTERY BYPASS GRAFTING (CABG), ON PUMP, TIMES FOUR, USING LEFT INTERNAL MAMMARY ARTERY AND RIGHT ENDOSCOPICALLY HARVESTED GREATER SAPHENOUS VEIN (N/A) ?TRANSESOPHAGEAL ECHOCARDIOGRAM (TEE) (N/A) ? ?-Postop day 6 CABG x4 after presenting with out of hospital cardiac arrest.  Preop ejection fraction 55 to 60%.  BB held due to bradycardia. HR stable 60-70.   On ASA and statin. On oral amiodarone for cardiac arrest prior to admission.  ? ?-Heme-expected acute blood loss anemia.  Hematocrit 25% yesterday.  He seems to be tolerating this reasonably well.  Monitor. ? ?-Pulm-CXR showing small left effusion and basilar ATX.   Continue diuresis and pulmonary hygiene. F/U CXR tomorrow. ? ?-GI-abdomen benign.  BM x 2 on Sunday.  Diet as tolerated.  ? ?-Renal-baseline creatinine ~1, 1.2 yesterday.    Weight remains several Kg positive.  Continue diuresis with torsemide and will add metolazone x 1 dose today.   Watch creat.  ? ?-Neuro-intact ? ?-DVT PPx-continue enoxaparin daily ? ?-Insomnia- Says he rested better with trazodone last night. Will continue.  ? ? ?Antony Odea, PA-C ?[phone] ?01/24/2022 8:14 AM ?Patient seen while walking in hallway ?Agree with above ?Hopefully home in 1-2 days ? ?Revonda Standard Roxan Hockey, MD ?[facility] ?[phone] ? ?

## 2022-01-24 NOTE — Progress Notes (Signed)
Pt found just back from a walk with his wife, 470 ft with RW on 2L. SaO2 getting in bed 84 2L. Increased to 4L briefly to achieve 91 4L. Wife sts SAO2 walking was around 86. Discussed needing more O2 while walking. Will f/u at 1145 per pt request. PT to see later in pm. ?0900-0910 ?Ethelda Chick CES, ACSM ?9:27 AM ?01/24/2022 ? ?

## 2022-01-24 NOTE — Progress Notes (Addendum)
Occupational Therapy Treatment ?Patient Details ?Name: Jeremy Winters ?MRN: [id number] ?DOB: 07/14/1966 ?Today's Date: 01/24/2022 ? ? ?History of present illness The pt is a 56 yo male presenting 2/23 after cardiac arrest while at the gym. Pt required x2 Epi, x3 shocks, and 18 min CPR, found to have STEMI with Vfib/vtach arrest. S/p L heart cath 2/23, CABG x4 on 3/1. ?  ?OT comments ? Session focused on continued cognitive assessment with pill box test. Pt able to complete pill box test with 0 errors and demonstrate anticipatory insight for effective medication mgmt thus meeting this OT goal. Encouraged pt to have spouse present for initial med mgmt especially if discharged home with new medications to maximize compliance. Pt unable to independently verbalize sternal precautions but able to implement them during tasks without cues (additional handout provided). Pt's spouse at bedside, engaged in education re: energy conservation, safe use of DME at home, compensatory strategies for ADLs and gradual progression of endurance.  ? ?SpO2 95% on 2 L O2.  ? ?Recommendations for follow up therapy are one component of a multi-disciplinary discharge planning process, led by the attending physician.  Recommendations may be updated based on patient status, additional functional criteria and insurance authorization. ?   ?Follow Up Recommendations ? No OT follow up  ?  ?Assistance Recommended at Discharge Intermittent Supervision/Assistance  ?Patient can return home with the following ? A little help with walking and/or transfers;A little help with bathing/dressing/bathroom;Assistance with cooking/housework;Direct supervision/assist for medications management;Direct supervision/assist for financial management;Assist for transportation ?  ?Equipment Recommendations ? BSC/3in1 (for use as shower chair)  ?  ?Recommendations for Other Services Other (comment) (possible neuropsych eval after DC if memory deficits persist) ? ?   ?Precautions / Restrictions Precautions ?Precautions: Sternal;Fall ?Precaution Booklet Issued: Yes (comment) ?Precaution Comments: watch SpO2, on 2L for mobility ?Restrictions ?Weight Bearing Restrictions: No  ? ? ?  ? ?Mobility Bed Mobility ?Overal bed mobility: Needs Assistance ?Bed Mobility: Rolling, Sidelying to Sit, Sit to Sidelying ?Rolling: Supervision ?Sidelying to sit: Supervision ?  ?  ?Sit to sidelying: Modified independent (Device/Increase time) ?General bed mobility comments: no assist needed. wife about to jump in a cue pt to avoid pushing through UE when getting back to bed ?  ? ?Transfers ?Overall transfer level: Needs assistance ?Equipment used: None ?Transfers: Sit to/from Stand ?Sit to Stand: Supervision ?  ?  ?  ?  ?  ?  ?  ?  ?Balance Overall balance assessment: Needs assistance ?Sitting-balance support: No upper extremity supported, Feet supported ?Sitting balance-Leahy Scale: Good ?  ?  ?Standing balance support: Bilateral upper extremity supported, During functional activity ?Standing balance-Leahy Scale: Fair ?Standing balance comment: able to stand without UE support statically, benefits from BUE support for mobility ?  ?  ?  ?  ?  ?  ?  ?  ?  ?  ?  ?   ? ?ADL either performed or assessed with clinical judgement  ? ?ADL Overall ADL's : Needs assistance/impaired ?  ?  ?  ?  ?  ?  ?  ?  ?  ?  ?Lower Body Dressing: Minimal assistance ?Lower Body Dressing Details (indicate cue type and reason): increased discomfort crossing LE to reach sock (R LE d/t brusing from fem line). Educated on easier clothing to don/manage at home ?  ?  ?  ?  ?  ?  ?  ?General ADL Comments: Focus on pill box test administration, education on DME use for energy conservation (BSC  as shower chair), use of bag on RW to safely carry items around house ?  ? ?Extremity/Trunk Assessment Upper Extremity Assessment ?Upper Extremity Assessment: Overall WFL for tasks assessed ?  ?Lower Extremity Assessment ?Lower Extremity  Assessment: Defer to PT evaluation ?  ?  ?  ? ?Vision   ?Vision Assessment?: No apparent visual deficits ?  ?Perception   ?  ?Praxis   ?  ? ?Cognition Arousal/Alertness: Awake/alert ?Behavior During Therapy: Flat affect ?Overall Cognitive Status: Impaired/Different from baseline ?Area of Impairment: Memory, Awareness, Problem solving, Attention ?  ?  ?  ?  ?  ?  ?  ?  ?  ?Current Attention Level: Selective ?Memory: Decreased short-term memory ?  ?  ?Awareness: Emergent ?Problem Solving: Slow processing ?General Comments: Administered pill box test with pt able to complete easily and without errors (able to anticipate "if i take the every other day pill on Sunday this week, it will start on Monday next week". Encouraged pt to go through medications with spouse at DC to ensure compliance. flat affect continues but appropriate in conversation. When asked to verbalize sternal precautions, pt  unable to spontaneously name them but able to implement without cues ?  ?  ?   ?Exercises   ? ?  ?Shoulder Instructions   ? ? ?  ?General Comments SpO2 95% on 2 L O2. Spouse at bedside  ? ? ?Pertinent Vitals/ Pain       Pain Assessment ?Pain Assessment: Faces ?Faces Pain Scale: Hurts a little bit ?Pain Location: R groin/LE with crossing LEs (reports brusing from fem line) ?Pain Descriptors / Indicators: Sore ?Pain Intervention(s): Monitored during session ? ?Home Living   ?  ?  ?  ?  ?  ?  ?  ?  ?  ?  ?  ?  ?  ?  ?  ?  ?  ?  ? ?  ?Prior Functioning/Environment    ?  ?  ?  ?   ? ?Frequency ? Min 2X/week  ? ? ? ? ?  ?Progress Toward Goals ? ?OT Goals(current goals can now be found in the care plan section) ? Progress towards OT goals: Progressing toward goals ? ?Acute Rehab OT Goals ?Patient Stated Goal: get back to independence ?OT Goal Formulation: With patient ?Time For Goal Achievement: 02/05/22 ?Potential to Achieve Goals: Good ?ADL Goals ?Pt Will Perform Lower Body Bathing: with modified independence ?Pt Will Perform Upper Body  Dressing: with modified independence ?Pt Will Perform Lower Body Dressing: with modified independence ?Pt Will Transfer to Toilet: with modified independence;ambulating ?Pt Will Perform Toileting - Clothing Manipulation and hygiene: with modified independence ?Additional ADL Goal #1: Pt will independently demonstrate 2 sternal precuations during ADL tasks ?Additional ADL Goal #2: Pt will receive a passing score on the PillBox Assessment  ?Plan Discharge plan remains appropriate   ? ?Co-evaluation ? ? ?   ?  ?  ?  ?  ? ?  ?AM-PAC OT "6 Clicks" Daily Activity     ?Outcome Measure ? ? Help from another person eating meals?: None ?Help from another person taking care of personal grooming?: A Little ?Help from another person toileting, which includes using toliet, bedpan, or urinal?: A Little ?Help from another person bathing (including washing, rinsing, drying)?: A Little ?Help from another person to put on and taking off regular upper body clothing?: A Little ?Help from another person to put on and taking off regular lower body clothing?: A Little ?6 Click  Score: 19 ? ?  ?End of Session Equipment Utilized During Treatment: Oxygen ? ?OT Visit Diagnosis: Unsteadiness on feet (R26.81);Muscle weakness (generalized) (M62.81);Other symptoms and signs involving cognitive function;Pain ?  ?Activity Tolerance Patient tolerated treatment well ?  ?Patient Left in bed;with call bell/phone within reach;with family/visitor present ?  ?Nurse Communication Mobility status ?  ? ?   ? ?Time: 0935-1000 ?OT Time Calculation (min): 25 min ? ?Charges: OT General Charges ?$OT Visit: 1 Visit ?OT Treatments ?$Self Care/Home Management : 23-37 mins ? ?Bradd Canary, OTR/L ?Acute Rehab Services ?Office: [phone]  ? ?Lorre Munroe ?01/24/2022, 10:23 AM ?

## 2022-01-25 ENCOUNTER — Inpatient Hospital Stay (HOSPITAL_COMMUNITY): Payer: 59

## 2022-01-25 HISTORY — PX: IR THORACENTESIS ASP PLEURAL SPACE W/IMG GUIDE: IMG5380

## 2022-01-25 LAB — CBC
HCT: 28.1 % — ABNORMAL LOW (ref 39.0–52.0)
Hemoglobin: 9.2 g/dL — ABNORMAL LOW (ref 13.0–17.0)
MCH: 29.7 pg (ref 26.0–34.0)
MCHC: 32.7 g/dL (ref 30.0–36.0)
MCV: 90.6 fL (ref 80.0–100.0)
Platelets: 446 10*3/uL — ABNORMAL HIGH (ref 150–400)
RBC: 3.1 MIL/uL — ABNORMAL LOW (ref 4.22–5.81)
RDW: 13.3 % (ref 11.5–15.5)
WBC: 14.1 10*3/uL — ABNORMAL HIGH (ref 4.0–10.5)
nRBC: 0.6 % — ABNORMAL HIGH (ref 0.0–0.2)

## 2022-01-25 LAB — BASIC METABOLIC PANEL
Anion gap: 10 (ref 5–15)
BUN: 22 mg/dL — ABNORMAL HIGH (ref 6–20)
CO2: 32 mmol/L (ref 22–32)
Calcium: 8.2 mg/dL — ABNORMAL LOW (ref 8.9–10.3)
Chloride: 88 mmol/L — ABNORMAL LOW (ref 98–111)
Creatinine, Ser: 1.43 mg/dL — ABNORMAL HIGH (ref 0.61–1.24)
GFR, Estimated: 58 mL/min — ABNORMAL LOW (ref >60)
Glucose, Bld: 130 mg/dL — ABNORMAL HIGH (ref 70–99)
Potassium: 4.1 mmol/L (ref 3.5–5.1)
Sodium: 130 mmol/L — ABNORMAL LOW (ref 135–145)

## 2022-01-25 MED ORDER — CARVEDILOL 3.125 MG PO TABS
3.1250 mg | ORAL_TABLET | Freq: Two times a day (BID) | ORAL | Status: DC
Start: 2022-01-25 — End: 2022-01-26
  Administered 2022-01-25 – 2022-01-26 (×2): 3.125 mg via ORAL
  Filled 2022-01-25 (×2): qty 1

## 2022-01-25 MED ORDER — LIDOCAINE HCL 1 % IJ SOLN
INTRAMUSCULAR | Status: AC
  Filled 2022-01-25: qty 20

## 2022-01-25 MED ORDER — LIDOCAINE HCL 1 % IJ SOLN
INTRAMUSCULAR | Status: DC | PRN
  Administered 2022-01-25: 10 mL

## 2022-01-25 NOTE — TOC Initial Note (Signed)
Transition of Care (TOC) - Initial/Assessment Note  ?Donn Pierini Charity fundraiser, BSN ?Transitions of Care ?Unit 4E- RN Case Manager ?See Treatment Team for direct phone #  ? ? ?Patient Details  ?Name: Jeremy Winters ?MRN: [id number] ?Date of Birth: 1966-04-26 ? ?Transition of Care (TOC) CM/SW Contact:    ?Zenda Alpers, Lenn Sink, RN ?Phone Number: ?01/25/2022, 4:13 PM ? ?Clinical Narrative:                 ?Pt admitted from home w/ cardiac arrest s/p CABG. Notified by cardiac rehab that pt will need home 02. Bedside RN to reach out to attending team for home 02 orders. DME orders already in place for RW and 3n1. ? ?Pt s/p thoracentesis today.  ?Call made to [facility] with [employer] for DME and home 02 needs- Zach to see if they are in-network with pt's [employer]. [employer] to f/u for insurance auth and if approved will delivery DME and 02 to room prior to discharge.  ? ?TOC to continue to follow.  ? ?Expected Discharge Plan: Home/Self Care ?Barriers to Discharge: Continued Medical Work up ? ? ?Patient Goals and CMS Choice ?Patient states their goals for this hospitalization and ongoing recovery are:: return home ?  ?  ? ?Expected Discharge Plan and Services ?Expected Discharge Plan: Home/Self Care ?  ?Discharge Planning Services: CM Consult ?Post Acute Care Choice: Durable Medical Equipment ?Living arrangements for the past 2 months: Single Family Home ?                ?DME Arranged: 3-N-1, Walker rolling, Oxygen ?DME Agency: [employer] ?Date DME Agency Contacted: 01/25/22 ?Time DME Agency Contacted: 1500 ?Representative spoke with at DME Agency: Ian Malkin ?HH Arranged: NA ?HH Agency: NA ?  ?  ?  ? ?Prior Living Arrangements/Services ?Living arrangements for the past 2 months: Single Family Home ?Lives with:: Significant Other ?Patient language and need for interpreter reviewed:: Yes ?Do you feel safe going back to the place where you live?: Yes      ?Need for Family Participation in Patient Care: Yes (Comment) ?Care giver support system  in place?: Yes (comment) ?  ?Criminal Activity/Legal Involvement Pertinent to Current Situation/Hospitalization: No - Comment as needed ? ?Activities of Daily Living ?Home Assistive Devices/Equipment: None ?ADL Screening (condition at time of admission) ?Patient's cognitive ability adequate to safely complete daily activities?: Yes ?Is the patient deaf or have difficulty hearing?: Yes ?Does the patient have difficulty seeing, even when wearing glasses/contacts?: No ?Does the patient have difficulty concentrating, remembering, or making decisions?: No ?Patient able to express need for assistance with ADLs?: Yes ?Does the patient have difficulty dressing or bathing?: No ?Independently performs ADLs?: Yes (appropriate for developmental age) ?Does the patient have difficulty walking or climbing stairs?: No ?Weakness of Legs: None ?Weakness of Arms/Hands: None ? ?Permission Sought/Granted ?  ?  ?   ?   ?   ?   ? ?Emotional Assessment ?Appearance:: Appears stated age ?  ?Affect (typically observed): Pleasant ?Orientation: : Oriented to Self, Oriented to Place, Oriented to  Time, Oriented to Situation ?Alcohol / Substance Use: Not Applicable ?Psych Involvement: No (comment) ? 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 Diagnosis Date Noted  ? 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 Abnormal LFTs 01/17/2022  ? AKI (acute kidney injury) (HCC) 01/15/2022  ? NSTEMI (non-ST elevated myocardial infarction) (HCC) 01/15/2022  ? Hyperlipidemia 01/15/2022  ? Essential hypertension 01/15/2022  ? Cardiogenic  shock (HCC) 01/15/2022  ? Acute respiratory failure with hypoxia (HCC) 01/15/2022  ? Hyperglycemia 01/15/2022  ? Leukocytosis 01/15/2022  ? Normocytic anemia 01/15/2022  ? Thrombocytopenia (HCC) 01/15/2022  ? Overweight with body mass index (BMI) 25.0-29.9 01/15/2022  ? Unresponsive 01/12/2022  ? Cardiopulmonary arrest with successful resuscitation (HCC) 01/12/2022  ? Cardiac arrest  (HCC) 01/12/2022  ? Endotracheally intubated   ? ?PCP:  Patient, No Pcp Per (Inactive) ?Pharmacy:   ?[facility] - [place] - [address] ?[address] ?[place] [address] ?Phone: [phone] Fax: [phone] ? ?[facility] ?[address] ?[place] [address] ?Phone: [phone] Fax: [phone] ? ? ? ? ?Social Determinants of Health (SDOH) Interventions ?  ? ?Readmission Risk Interventions ?No flowsheet data found. ? ? ?

## 2022-01-25 NOTE — Progress Notes (Addendum)
Physical Therapy Treatment Patient Details Name: Jeremy Winters MRN: [id number] DOB: 04/03/1966 Today's Date: 01/25/2022   History of Present Illness The pt is a 56 yo male presenting 2/23 after cardiac arrest while at the gym. Pt required x2 Epi, x3 shocks, and 18 min CPR, found to have STEMI with Vfib/vtach arrest. S/p L heart cath 2/23, CABG x4 on 3/1.    PT Comments    Pt received in supine, agreeable to therapy session and with good participation and tolerance for gait and stair training. Pt scored 19/24 on DGI. Dynamic Gait Index. Scores of 19 or less are predictive of falls in older community living adults. Pt gait speed slow at 0.33 m/s. Gait speed <0.4 m/s indicates increased risk of falls for household gait tasks, continue to recommend supervision/assist for all OOB mobility due to increased fall risk. Pt performed wayfinding task in hallway, needing hints/cues to recall all 3 tasks given. Pt needing Supervision/cues for gait/log rolling and min guard to safely ascend/descend flight of stairs. Pt continues to benefit from PT services to progress toward functional mobility goals. DME recommendations updated below per discussion with pt and supervising PT Caroline B as pt not requiring RW or BSC during mobility tasks. Anticipate pt may have post-acute O2 needs as he needs 2L O2 [place] to consistently maintain SpO2 >92% at rest/with mobility tasks.  Recommendations for follow up therapy are one component of a multi-disciplinary discharge planning process, led by the attending physician.  Recommendations may be updated based on patient status, additional functional criteria and insurance authorization.  Follow Up Recommendations  No PT follow up (phase 2 cardiac rehab, OPPT if unable to get into cardiac rehab)     Assistance Recommended at Discharge Intermittent Supervision/Assistance  Patient can return home with the following A little help with walking and/or transfers;A little help with  bathing/dressing/bathroom;Assistance with cooking/housework;Assist for transportation;Help with stairs or ramp for entrance   Equipment Recommendations  None recommended by PT    Recommendations for Other Services       Precautions / Restrictions Precautions Precautions: Sternal;Fall Precaution Booklet Issued: Yes (comment) Precaution Comments: Watch SpO2, on 2L for mobility Restrictions Weight Bearing Restrictions: No     Mobility  Bed Mobility Overal bed mobility: Needs Assistance Bed Mobility: Rolling, Sidelying to Sit Rolling: Modified independent (Device/Increase time) Sidelying to sit: Supervision       General bed mobility comments: cues not to wing L elbow out when log rolling to L EOB, pt/spouse receptive    Transfers Overall transfer level: Needs assistance Equipment used: None Transfers: Sit to/from Stand Sit to Stand: Supervision           General transfer comment: from EOB, no AD; cues for putting hands on knees for stand>sit for improved eccentric control as he tends to plop    Ambulation/Gait Ambulation/Gait assistance: Supervision Gait Distance (Feet): 500 Feet Assistive device: None Gait Pattern/deviations: Step-through pattern, Decreased stride length Gait velocity: 1.09 ft/sec or 0.33 m/s Gait velocity interpretation: <1.31 ft/sec, indicative of household ambulator   General Gait Details: SpO2 on 2L when received reading 97%; SpO2 reduced to 1L and maintains 89-94% on 1L. Weaned to RA after gait while seated EOB and SpO2 desat to 87% so returned to 2L O2 [place], RN notified. No LOB, pt performed wayfinding activity in hallway and able to follow 2-step commands with increased time and min cues to recall next task.   Stairs Stairs: Yes Stairs assistance: Min guard Stair Management: One rail Right, Alternating  pattern, Step to pattern, Forwards Number of Stairs: 12 General stair comments: cues for sequencing, pt able to ascend reciprocal pattern  but steadier and less apparent pain with step-to pattern so encouraged this; mildly unsteady with descent but only min guard for safety; VSS on 2L O2 [place]   Wheelchair Mobility    Modified Rankin (Stroke Patients Only)       Balance Overall balance assessment: Needs assistance Sitting-balance support: No upper extremity supported, Feet supported Sitting balance-Leahy Scale: Good     Standing balance support: Bilateral upper extremity supported, During functional activity Standing balance-Leahy Scale: Fair Standing balance comment: no UE support no LOB but stiff upright posture at times                 Standardized Balance Assessment Standardized Balance Assessment : Dynamic Gait Index   Dynamic Gait Index Level Surface: Mild Impairment Change in Gait Speed: Normal Gait with Horizontal Head Turns: Normal Gait with Vertical Head Turns: Normal Gait and Pivot Turn: Normal Step Over Obstacle: Mild Impairment Step Around Obstacles: Mild Impairment Steps: Moderate Impairment Total Score: 19      Cognition Arousal/Alertness: Awake/alert Behavior During Therapy: Flat affect Overall Cognitive Status: Impaired/Different from baseline Area of Impairment: Memory, Problem solving                     Memory: Decreased short-term memory       Problem Solving: Slow processing General Comments: Flat affect continues but appropriate in conversation and following 1 and 2-step cues well. Pt performed wayfinding activity in hallway with min cues to remind him to perform next task after initial task complete (walk to stairwell, ascend flight of steps, find N tower hallway) but able to recall all tasks after initial instruction with increased time to process/switch to next task.        Exercises Other Exercises Other Exercises: incentive spirometer x10 - able to pull 1000 mL today Other Exercises: seated BLE AROM: ankle pumps, hip flexion x10 reps ea    General Comments  General comments (skin integrity, edema, etc.): SpO2 89-92% on 1L O2 [place] with exertion, 92% and greater on 2L, per RN OK to keep on 2L O2 [place] at end of session. HR WFL 70's bpm, tele battery replaced.      Pertinent Vitals/Pain Pain Assessment Pain Assessment: 0-10 Pain Score: 5  Pain Location: incisional discomfort and BLE pain Pain Descriptors / Indicators: Sore, Discomfort Pain Intervention(s): Monitored during session, Repositioned, RN gave pain meds during session    Home Living                          Prior Function            PT Goals (current goals can now be found in the care plan section) Acute Rehab PT Goals Patient Stated Goal: return to exercising and hiking outside and work PT Goal Formulation: With patient Time For Goal Achievement: 02/04/22 Progress towards PT goals: Progressing toward goals    Frequency    Min 3X/week      PT Plan Equipment recommendations need to be updated    Co-evaluation              AM-PAC PT "6 Clicks" Mobility   Outcome Measure  Help needed turning from your back to your side while in a flat bed without using bedrails?: None Help needed moving from lying on your back to sitting on the  side of a flat bed without using bedrails?: A Little Help needed moving to and from a bed to a chair (including a wheelchair)?: None Help needed standing up from a chair using your arms (e.g., wheelchair or bedside chair)?: None Help needed to walk in hospital room?: A Little Help needed climbing 3-5 steps with a railing? : A Little 6 Click Score: 21    End of Session Equipment Utilized During Treatment: Oxygen;Gait belt Activity Tolerance: Patient tolerated treatment well Patient left: in bed;with call bell/phone within reach;with family/visitor present (pt seated EOB, spouse present) Nurse Communication: Mobility status;Other (comment) (O2 sats, pt pain score) PT Visit Diagnosis: Unsteadiness on feet (R26.81);Other  abnormalities of gait and mobility (R26.89)     Time: [id number] PT Time Calculation (min) (ACUTE ONLY): 40 min  Charges:  $Gait Training: 8-22 mins $Therapeutic Exercise: 8-22 mins $Therapeutic Activity: 8-22 mins                     Deyton Ellenbecker P., PTA Acute Rehabilitation Services Pager: [phone] Office: [place] 01/25/2022, 12:12 PM

## 2022-01-25 NOTE — Progress Notes (Addendum)
? ?   ?  [address] ?      [place] [address] ?            [phone]   ? ?  ?7 Days Post-Op Procedure(s) (LRB): ?CORONARY ARTERY BYPASS GRAFTING (CABG), ON PUMP, TIMES FOUR, USING LEFT INTERNAL MAMMARY ARTERY AND RIGHT ENDOSCOPICALLY HARVESTED GREATER SAPHENOUS VEIN (N/A) ?TRANSESOPHAGEAL ECHOCARDIOGRAM (TEE) (N/A) ? ?Subjective: ? ?Patient doing better.  He states he was able to urinate a lot yesterday.  He is ambulating in the hallway, but does have some increased oxygen requirement with ambulation.  + BM ? ?Objective: ?Vital signs in last 24 hours: ?Temp:  [97.8 ?F (36.6 ?C)-98.8 ?F (37.1 ?C)] 98.8 ?F (37.1 ?C) (03/08 3220) ?Pulse Rate:  [65-75] 72 (03/08 0508) ?Cardiac Rhythm: Normal sinus rhythm (03/08 0100) ?Resp:  [15-20] 15 (03/08 0508) ?BP: (113-134)/(67-73) 122/70 (03/08 2542) ?SpO2:  [92 %-97 %] 92 % (03/08 0508) ?Weight:  [93.6 kg] 93.6 kg (03/08 0600) ? ?Intake/Output from previous day: ?03/07 0701 - 03/08 0700 ?In: 420 [P.O.:420] ?Out: 4650 [Urine:4650] ? ?General appearance: alert, cooperative, and no distress ?Heart: regular rate and rhythm ?Lungs: diminished breath sounds left base ?Abdomen: soft, non-tender; bowel sounds normal; no masses,  no organomegaly ?Extremities: edema trace ?Wound: clean and dry ? ?Lab Results: ?Recent Labs  ?  01/23/22 ?0131 01/25/22 ?0153  ?WBC 15.4* 14.1*  ?HGB 8.4* 9.2*  ?HCT 25.1* 28.1*  ?PLT 307 446*  ? ?BMET:  ?Recent Labs  ?  01/23/22 ?0131 01/25/22 ?0153  ?NA 132* 130*  ?K 4.2 4.1  ?CL 95* 88*  ?CO2 30 32  ?GLUCOSE 109* 130*  ?BUN 22* 22*  ?CREATININE 1.20 1.43*  ?CALCIUM 7.6* 8.2*  ?  ?PT/INR: No results for input(s): LABPROT, INR in the last 72 hours. ?ABG ?   ?Component Value Date/Time  ? PHART 7.466 (H) 01/19/2022 0309  ? HCO3 24.7 01/19/2022 0309  ? TCO2 26 01/19/2022 0309  ? ACIDBASEDEF 1.0 01/18/2022 1841  ? O2SAT 95 01/19/2022 0309  ? ?CBG (last 3)  ?Recent Labs  ?  01/22/22 ?2129 01/23/22 ?7062 01/23/22 ?1206  ?GLUCAP 130* 98 95   ? ? ?Assessment/Plan: ?S/P Procedure(s) (LRB): ?CORONARY ARTERY BYPASS GRAFTING (CABG), ON PUMP, TIMES FOUR, USING LEFT INTERNAL MAMMARY ARTERY AND RIGHT ENDOSCOPICALLY HARVESTED GREATER SAPHENOUS VEIN (N/A) ?TRANSESOPHAGEAL ECHOCARDIOGRAM (TEE) (N/A) ? ?CV- NSR, BP improved- continue Amiodarone at 200 mg BID, Cozaar, will try Coreg at 3.125 mg BID today, if HR allows  ?Pulm- left pleural effusion, no improvement with diuresis, + atelectasis as well.. will have IR assess to see if enough fluid is present to drain.Marland Kitchen otherwise patient requiring oxygen with activity and will need to arrange home use ?Renal- slight increase in creatinine to 1.43, this is due to [facility] yesterday, will not repeat, weight remains elevated will continue Demadex ?Expected post operative blood loss anemia, Hgb at 9.2 ?Dispo- patient stable, will have IR evaluate to see if pleural effusion warrants drainage, continue Demadex, will have nurse get O2 saturations documented, patient would prefer to be discharged today, will depend on timing of IR evaluation ? ? LOS: 13 days  ? ?Lowella Dandy, PA-C ?01/25/2022 ? ? ?

## 2022-01-25 NOTE — Procedures (Signed)
PROCEDURE SUMMARY: ? ?Successful US guided therapeutic left thoracentesis. ?Yielded 450 cc of serosanguinous fluid. ?Pt tolerated procedure well. ?No immediate complications. ? ?Specimen not sent for labs. ?CXR ordered. ? ?EBL < 1 mL ? ?Shon Hough, AGNP ?01/25/2022 ?1:38 PM ? ?  ? ?

## 2022-01-25 NOTE — Progress Notes (Signed)
CARDIAC REHAB PHASE I  ? ?PRE:  Rate/Rhythm: 73 SR ? ?  BP: sitting 122/73 ? ?  SaO2: 97 2L, 90 RA in bed ? ?MODE:  Ambulation: 800 ft  ? ?POST:  Rate/Rhythm: 93 SR ? ?  BP: sitting 141/81  ? ?  SaO2: 90 1L ? ?D/c'd O2 in bed and pt maintained 90-92 RA at 30 degrees during education. Pt unfortunately desat after 100 ft on RA to 86. Applied 1L and walked long distance on 1L, maintaining 90-92 1L. Steady, no c/o. D/c'd O2 again on return to room for resting.  ? ?Discussed IS, sternal precautions, diet, exercise, and CRPII. Pt receptive. Will refer to [place]. ?[id number]  ? ?[place], ACSM ?01/25/2022 ?2:46 PM ? ? ? ? ?

## 2022-01-25 NOTE — Progress Notes (Signed)
SATURATION QUALIFICATIONS: (This note is used to comply with regulatory documentation for home oxygen) ? ?Patient Saturations on Room Air at Rest = 90% ? ?Patient Saturations on Room Air while Ambulating = 86% ? ?Patient Saturations on 1 Liters of oxygen while Ambulating = 92% ? ?Please briefly explain why patient needs home oxygen: ?Pt SAO2 decreases with ambulation. Maintains >90 on 1L. See my note from today for more details. ?[phone] ?Ethelda Chick CES, ACSM ?2:52 PM ?01/25/2022 ? ?

## 2022-01-26 ENCOUNTER — Other Ambulatory Visit (HOSPITAL_COMMUNITY): Payer: Self-pay

## 2022-01-26 ENCOUNTER — Inpatient Hospital Stay (HOSPITAL_COMMUNITY): Payer: 59

## 2022-01-26 LAB — BASIC METABOLIC PANEL
Anion gap: 13 (ref 5–15)
BUN: 25 mg/dL — ABNORMAL HIGH (ref 6–20)
CO2: 32 mmol/L (ref 22–32)
Calcium: 8.5 mg/dL — ABNORMAL LOW (ref 8.9–10.3)
Chloride: 87 mmol/L — ABNORMAL LOW (ref 98–111)
Creatinine, Ser: 1.29 mg/dL — ABNORMAL HIGH (ref 0.61–1.24)
GFR, Estimated: >60 mL/min (ref >60)
Glucose, Bld: 146 mg/dL — ABNORMAL HIGH (ref 70–99)
Potassium: 3.7 mmol/L (ref 3.5–5.1)
Sodium: 132 mmol/L — ABNORMAL LOW (ref 135–145)

## 2022-01-26 MED ORDER — TORSEMIDE 20 MG PO TABS
20.0000 mg | ORAL_TABLET | Freq: Every day | ORAL | 0 refills | Status: DC
  Filled 2022-01-26: qty 7, 7d supply, fill #0

## 2022-01-26 MED ORDER — AMIODARONE HCL 200 MG PO TABS
200.0000 mg | ORAL_TABLET | Freq: Every day | ORAL | 1 refills | Status: DC
  Filled 2022-01-26: qty 30, 30d supply, fill #0

## 2022-01-26 MED ORDER — ASPIRIN 81 MG PO TBEC
81.0000 mg | DELAYED_RELEASE_TABLET | Freq: Every day | ORAL | 11 refills | Status: AC
  Filled 2022-01-26: qty 30, 30d supply, fill #0

## 2022-01-26 MED ORDER — LIDOCAINE 5 % EX PTCH
1.0000 | MEDICATED_PATCH | CUTANEOUS | 0 refills | Status: DC
  Filled 2022-01-26: qty 30, 30d supply, fill #0

## 2022-01-26 MED ORDER — KETOROLAC TROMETHAMINE 30 MG/ML IJ SOLN
30.0000 mg | Freq: Once | INTRAMUSCULAR | Status: AC
Start: 2022-01-26 — End: 2022-01-26
  Administered 2022-01-26: 12:00:00 30 mg via INTRAVENOUS
  Filled 2022-01-26: qty 1

## 2022-01-26 MED ORDER — CARVEDILOL 3.125 MG PO TABS
3.1250 mg | ORAL_TABLET | Freq: Two times a day (BID) | ORAL | 3 refills | Status: AC
  Filled 2022-01-26: qty 60, 30d supply, fill #0

## 2022-01-26 MED ORDER — ATORVASTATIN CALCIUM 80 MG PO TABS
80.0000 mg | ORAL_TABLET | Freq: Every day | ORAL | 3 refills | Status: AC
  Filled 2022-01-26: qty 30, 30d supply, fill #0

## 2022-01-26 MED ORDER — POTASSIUM CHLORIDE CRYS ER 20 MEQ PO TBCR
20.0000 meq | EXTENDED_RELEASE_TABLET | Freq: Every day | ORAL | 0 refills | Status: DC
  Filled 2022-01-26: qty 7, 7d supply, fill #0

## 2022-01-26 MED ORDER — ACETAMINOPHEN 325 MG PO TABS
650.0000 mg | ORAL_TABLET | Freq: Four times a day (QID) | ORAL | Status: AC | PRN
Start: 2022-01-26 — End: ?

## 2022-01-26 MED ORDER — LOSARTAN POTASSIUM 25 MG PO TABS
25.0000 mg | ORAL_TABLET | Freq: Every day | ORAL | 3 refills | Status: AC
Start: 2022-01-27 — End: ?
  Filled 2022-01-26: qty 30, 30d supply, fill #0

## 2022-01-26 MED ORDER — OXYCODONE HCL 5 MG PO TABS
10.0000 mg | ORAL_TABLET | ORAL | 0 refills | Status: DC | PRN
  Filled 2022-01-26: qty 30, 3d supply, fill #0

## 2022-01-26 MED ORDER — LIDOCAINE 5 % EX PTCH
1.0000 | MEDICATED_PATCH | CUTANEOUS | Status: DC
  Administered 2022-01-26: 13:00:00 1 via TRANSDERMAL
  Filled 2022-01-26: qty 1

## 2022-01-26 MED ORDER — CLOPIDOGREL BISULFATE 75 MG PO TABS
75.0000 mg | ORAL_TABLET | Freq: Every day | ORAL | 3 refills | Status: AC
  Filled 2022-01-26: qty 30, 30d supply, fill #0

## 2022-01-26 MED ORDER — AMIODARONE HCL 200 MG PO TABS
200.0000 mg | ORAL_TABLET | Freq: Every day | ORAL | Status: DC
  Administered 2022-01-26: 10:00:00 200 mg via ORAL
  Filled 2022-01-26: qty 1

## 2022-01-26 NOTE — Progress Notes (Signed)
01/26/2022  ?1300 ?Pt ambulated without walker but with wife by his side 415ft.  Had to stop a couple times to rest.  He did not wear the oxygen while ambulating and his O2 stayed around 90%.   ?Debara Pickett C ? ?

## 2022-01-26 NOTE — TOC Transition Note (Signed)
Transition of Care (TOC) - CM/SW Discharge Note ?Donn Pierini Charity fundraiser, BSN ?Transitions of Care ?Unit 4E- RN Case Manager ?See Treatment Team for direct phone #  ? ? ?Patient Details  ?Name: Jeremy Winters ?MRN: [id number] ?Date of Birth: October 02, 1966 ? ?Transition of Care (TOC) CM/SW Contact:  ?Zenda Alpers, Lenn Sink, RN ?Phone Number: ?01/26/2022, 2:34 PM ? ? ?Clinical Narrative:    ?Pt stable for transition home today. DME has been delivered to room per [employer]- rolling walker, 3n1, and home 02 portable tank/concentrator. ? ?TOC pharmacy to deliver meds to bedside- Call received from pharmacy regarding drug coverage. CM spoke with pt at bedside- insurance card provided by pt- he does have drug coverage. Info provided to Alinda Money in [facility] pharmacy and copy placed on chart.  ? ?Family at bedside and will provide transportation home.  ? ? ?Final next level of care: Home/Self Care ?Barriers to Discharge: Barriers Resolved ? ? ?Patient Goals and CMS Choice ?Patient states their goals for this hospitalization and ongoing recovery are:: return home ?  ?  ? ?Discharge Placement ?  ?           ? Home  ?  ?  ?  ? ?Discharge Plan and Services ?  ?Discharge Planning Services: CM Consult ?Post Acute Care Choice: Durable Medical Equipment          ?DME Arranged: 3-N-1, Walker rolling, Oxygen ?DME Agency: [employer] ?Date DME Agency Contacted: 01/25/22 ?Time DME Agency Contacted: 1500 ?Representative spoke with at DME Agency: Ian Malkin ?HH Arranged: NA ?HH Agency: NA ?  ?  ?  ? ?Social Determinants of Health (SDOH) Interventions ?  ? ? ?Readmission Risk Interventions ?Readmission Risk Prevention Plan 01/26/2022  ?Post Dischage Appt Complete  ?Medication Screening Complete  ?Transportation Screening Complete  ?Some recent data might be hidden  ? ? ? ? ? ?

## 2022-01-26 NOTE — Progress Notes (Signed)
01/26/2022 ?1400 ?Chest tube sutures removed.  Pt tolerated well. ?Jeremy Winters C ? ? ?

## 2022-01-26 NOTE — Progress Notes (Signed)
01/26/2022 ?3:55 PM ?Discharge AVS meds taken today and those due this evening reviewed.  Follow-up appointments and when to call md reviewed.  D/C IV and TELE.  Questions and concerns addressed.   D/C home per orders.  ?Debara Pickett C ? ?

## 2022-01-26 NOTE — Progress Notes (Addendum)
? ?   ?  [address] ?      [place] [address] ?            [phone]   ? ?  ?8 Days Post-Op Procedure(s) (LRB): ?CORONARY ARTERY BYPASS GRAFTING (CABG), ON PUMP, TIMES FOUR, USING LEFT INTERNAL MAMMARY ARTERY AND RIGHT ENDOSCOPICALLY HARVESTED GREATER SAPHENOUS VEIN (N/A) ?TRANSESOPHAGEAL ECHOCARDIOGRAM (TEE) (N/A) ? ?Subjective: ? ?Patient having a lot of pain this morning.  He states that it started around 830 last night in the area where the procedure was performed.  He hasn't complained of much pain prior to this.  He has taken oxycodone without much relief   ? ?Objective: ?Vital signs in last 24 hours: ?Temp:  [97.6 ?F (36.4 ?C)-98.3 ?F (36.8 ?C)] 97.6 ?F (36.4 ?C) (03/09 [id number]) ?Pulse Rate:  [67-72] 67 (03/09 0337) ?Cardiac Rhythm: Normal sinus rhythm (03/09 0340) ?Resp:  [18-20] 19 (03/09 0337) ?BP: (110-128)/(63-79) 113/71 (03/09 [id number]) ?SpO2:  [93 %-96 %] 95 % (03/09 0337) ?Weight:  [87.4 kg] 87.4 kg (03/09 0512) ? ?Intake/Output from previous day: ?03/08 0701 - 03/09 0700 ?In: 120 [P.O.:120] ?Out: 1350 [id number] ? ?General appearance: alert, cooperative, and no distress ?Heart: regular rate and rhythm ?Lungs: clear to auscultation bilaterally ?Abdomen: soft, non-tender; bowel sounds normal; no masses,  no organomegaly ?Extremities: edema trace ?Wound: clean and dry ? ?Lab Results: ?Recent Labs  ?  01/25/22 ?0153  ?WBC 14.1*  ?HGB 9.2*  ?HCT 28.1*  ?PLT 446*  ? ?BMET:  ?Recent Labs  ?  01/25/22 ?0153  ?NA 130*  ?K 4.1  ?CL 88*  ?CO2 32  ?GLUCOSE 130*  ?BUN 22*  ?CREATININE 1.43*  ?CALCIUM 8.2*  ?  ?PT/INR: No results for input(s): LABPROT, INR in the last 72 hours. ?ABG ?   ?Component Value Date/Time  ? PHART 7.466 (H) 01/19/2022 0309  ? HCO3 24.7 01/19/2022 0309  ? TCO2 26 01/19/2022 0309  ? ACIDBASEDEF 1.0 01/18/2022 1841  ? O2SAT 95 01/19/2022 0309  ? ?CBG (last 3)  ?Recent Labs  ?  01/23/22 ?1206  ?GLUCAP 95  ? ? ?Assessment/Plan: ?S/P Procedure(s) (LRB): ?CORONARY ARTERY BYPASS  GRAFTING (CABG), ON PUMP, TIMES FOUR, USING LEFT INTERNAL MAMMARY ARTERY AND RIGHT ENDOSCOPICALLY HARVESTED GREATER SAPHENOUS VEIN (N/A) ?TRANSESOPHAGEAL ECHOCARDIOGRAM (TEE) (N/A) ? ?CV- NSR, BP improved- decrease Amiodarone to 200 mg daily, continue Coreg at 3.125 mg BID, and Cozaar at 25 mg daily ?Pulm- increased pain post Thoracentesis, they were able to remove 450 cc of fluid, f/u CXR was w/o pneumothorax, STAT CXR w/o pneumothorax,  atelectasis, will add flutter valve, unable to wean oxygen with activity, home use has been arranged ?Renal- BMET ordered, will review once completed, weight continues to trend down, continue demadex ?ID- CXR is suggestive of pneumonia vs. atelectasis, he is afebrile, mild leukocytosis of 14K, discussed with Dr. Kipp Brood no need for ABX  ?Dispo- patient tolerating addition of Coreg yesterday, S/P Thoracentesis with removal of 450 cc fluid yesterday, increased pain at procedure site this morning,  xray was free of pneumothorax, unable to wean oxygen with activity, home use has been arranged, weight is trending down, continue Demadex, will review labs once completed... patient may be ready for d/c later today vs tomorrow depending on labs, and pain level ? ? LOS: 14 days  ? ? ?Ellwood Handler, PA-C ?01/26/2022 ? ? ?

## 2022-01-26 NOTE — Progress Notes (Signed)
Pt c/o 6/10 pain and refuses ambulation. Laying flat on 2L. Awaiting new IV and pain meds. Encouraged pt to ambulate after pain meds. Encouraged IS/flutter. ?[phone] ?Ethelda Chick CES, ACSM ?11:14 AM ?01/26/2022 ? ?

## 2022-01-26 NOTE — Progress Notes (Addendum)
Cardiology interval coverage note ? ?56 yo male with PMH of HTN and HLD who was admitted with VT arrest while exercise in the gym. Intubated prior to arrival. Cardiac cath revealed 3v CAD. Cardiac arrest was felt to be related to demand ischemia. Started on IV amiodarone, later transitioned to PO. Echo 2/23 showed EF 55-60%, grade 2 DD. Seen by CVTS, initially wanted to transfer to [place] hospital for CABG, however eventually decided to have CABG here. He underwent CABG x 4 on 01/18/2022 with LIMA-LAD, SVG-PLA of RCA, SVG to OM1, and SVG-OM2. INR remove 450 cc via thoracentesis on 01/25/2022. Per CVTS note, plan to d/c either today or tomorrow. ? ?Discharge meds: ?Amiodarone 200mg  daily ?ASA 81mg  daily, CTVS plan to add plavix 75 mg daily on discharge ?Coreg 3.125mg  BID ?Losartan 25mg  daily ?Torsemide and KCl per CVTS, BMET pending today. Cr slightly up yesterday. EF normal on Echo.  ? ?Currently scheduled to see me on 02/06/2022 ? ?Signed, ?Almyra Deforest PA ?Pager: [phone] ?  ? ?Seen & agree. ?Glenetta Hew, MD ? ? ? ? ?

## 2022-01-27 ENCOUNTER — Telehealth: Payer: Self-pay

## 2022-01-27 NOTE — Telephone Encounter (Signed)
FMLA form completed and faxed to [phone] per pt's request./ ?Beginning LOA 01/12/22 through approx 04/17/22 ?Form mailed home address ?

## 2022-01-28 ENCOUNTER — Other Ambulatory Visit: Payer: Self-pay | Admitting: Thoracic Surgery (Cardiothoracic Vascular Surgery)

## 2022-01-28 MED ORDER — CEPHALEXIN 500 MG PO CAPS: 500.0000 mg | ORAL_CAPSULE | Freq: Three times a day (TID) | ORAL | 0 refills | Status: DC

## 2022-01-30 ENCOUNTER — Other Ambulatory Visit: Payer: Self-pay

## 2022-01-30 ENCOUNTER — Other Ambulatory Visit (HOSPITAL_COMMUNITY): Payer: Self-pay | Admitting: *Deleted

## 2022-01-30 ENCOUNTER — Telehealth: Payer: Self-pay

## 2022-01-30 ENCOUNTER — Telehealth (HOSPITAL_COMMUNITY): Payer: Self-pay

## 2022-01-30 DIAGNOSIS — I214 Non-ST elevation (NSTEMI) myocardial infarction: Secondary | ICD-10-CM

## 2022-01-30 DIAGNOSIS — Z951 Presence of aortocoronary bypass graft: Secondary | ICD-10-CM

## 2022-01-30 NOTE — Telephone Encounter (Signed)
Per phase I cardiac rehab, fax cardiac rehab to [facility] cardiac rehab. ?

## 2022-01-30 NOTE — Telephone Encounter (Signed)
Patient's wife, Selena Batten contacted the office stating she spoke with Dr. Cliffton Asters over the weekend about patient's right leg/EVH site s/p CABG with Dr. Donata Clay. Concerning for cellulitis, Dr. Cliffton Asters prescribed an antibiotic for patient to take and advised family to contact office today and send pictures for evaluation. Patient's wife was unable to send pictures through [place], but she is concerned about his leg. She states that it does look some better since starting the antibiotics on Saturday, but it is still hard, hot to touch, red and now is starting to "bubble up and weep". Made patient an appointment to be seen here in the office with a PA tomorrow, 3/14 for further evaluation. Contacted patient's wife back and advised of appointment time via voicemail. Advised to contact the office back. ?

## 2022-01-31 ENCOUNTER — Ambulatory Visit (INDEPENDENT_AMBULATORY_CARE_PROVIDER_SITE_OTHER): Payer: Self-pay | Admitting: Physician Assistant

## 2022-01-31 ENCOUNTER — Ambulatory Visit
Admission: RE | Admit: 2022-01-31 | Discharge: 2022-01-31 | Disposition: A | Discharge status: Home / Self Care | Payer: 59 | Source: Ambulatory Visit | Attending: Cardiothoracic Surgery | Admitting: Cardiothoracic Surgery

## 2022-01-31 ENCOUNTER — Encounter: Payer: Self-pay | Admitting: Physician Assistant

## 2022-01-31 ENCOUNTER — Other Ambulatory Visit: Payer: Self-pay

## 2022-01-31 VITALS — BP 112/65 | HR 65 | Temp 97.7°F | Resp 20 | Ht 69.0 in | Wt 194.0 lb

## 2022-01-31 DIAGNOSIS — Z951 Presence of aortocoronary bypass graft: Secondary | ICD-10-CM

## 2022-01-31 DIAGNOSIS — L03115 Cellulitis of right lower limb: Secondary | ICD-10-CM

## 2022-01-31 MED ORDER — SULFAMETHOXAZOLE-TRIMETHOPRIM 800-160 MG PO TABS: 1.0000 | ORAL_TABLET | Freq: Two times a day (BID) | ORAL | 0 refills | Status: AC

## 2022-01-31 MED ORDER — TORSEMIDE 20 MG PO TABS: 20.0000 mg | ORAL_TABLET | Freq: Two times a day (BID) | ORAL | 0 refills | Status: DC

## 2022-01-31 MED ORDER — POTASSIUM CHLORIDE CRYS ER 20 MEQ PO TBCR: 20.0000 meq | EXTENDED_RELEASE_TABLET | Freq: Two times a day (BID) | ORAL | 0 refills | Status: DC

## 2022-01-31 MED ORDER — OXYCODONE HCL 5 MG PO TABS: 5.0000 mg | ORAL_TABLET | ORAL | 0 refills | Status: AC | PRN

## 2022-01-31 NOTE — Patient Instructions (Signed)
Keep the right lower extremity elevated as much as possible ? ?Warm compresses to the right lower leg for at least 20 minutes 3-4 times a day ? ?Increase the torsemide to 20 mg by mouth twice daily ? ?Increase the potassium chloride to 20 mEq by mouth twice daily ? ?We will arrange for telephone follow-up on Thursday, 02/02/2022 ?

## 2022-01-31 NOTE — Progress Notes (Signed)
? ?   ?[address] ?      [place] [address] ?            [phone]   ? ?  ? ?HPI: ? ?Patient returns for postoperative follow-up having undergone CABG x4 by Dr. Maren Beach on 01/18/2022.  He had been brought to the hospital this week following an out of hospital cardiac arrest.  He collapsed while exercising on the treadmill at [employer].  He received CPR for 18 minutes and was defibrillated successfully.  He arrived at the hospital palpable pulse.  Non-ST elevation function.  High-sensitivity troponin peaked at 9000. ?The patient's early postoperative recovery while in the hospital was notable for bradycardia early postop.  For this reason, beta-blockers were held for several days.  He developed atrial fibrillation that was managed with amiodarone and successfully converted back to sinus rhythm.  He was in sinus rhythm and had an acceptable heart rate,  we added low-dose carvedilol.  His postoperative course was also significant for persistent hypoxia.  He had a small left pleural effusion that was drained by the interventional radiology team few days prior to discharge with 450 mL fluid removed.  He was discharged on 01/26/2022 with home health and home oxygen. ?Since hospital discharge the patient reports his endurance.  Breathing has continued to improve.  He is not requiring any supplemental oxygen at all.  He notified our practice 4 days ago that had increasing edema in his right lower extremity with some erythema.  He was started on oral Keflex empirically for suspected cellulitis and was asked to come to the office today for follow-up.  He is currently living in [place] and drove down today for this visit. ? ? ?Current Outpatient Medications  ?Medication Sig Dispense Refill  ? acetaminophen (TYLENOL) 325 MG tablet Take 2 tablets (650 mg total) by mouth every 6 (six) hours as needed for mild pain.    ? amiodarone (PACERONE) 200 MG tablet Take 1 tablet (200 mg total) by mouth daily. 30  tablet 1  ? aspirin 81 MG EC tablet Take 1 tablet (81 mg total) by mouth daily. Swallow whole. 30 tablet 11  ? atorvastatin (LIPITOR) 80 MG tablet Take 1 tablet (80 mg total) by mouth daily. 30 tablet 3  ? carvedilol (COREG) 3.125 MG tablet Take 1 tablet (3.125 mg total) by mouth 2 (two) times daily with a meal. 60 tablet 3  ? cephALEXin (KEFLEX) 500 MG capsule Take 1 capsule (500 mg total) by mouth 3 (three) times daily. 21 capsule 0  ? clopidogrel (PLAVIX) 75 MG tablet Take 1 tablet (75 mg total) by mouth daily. 30 tablet 3  ? lidocaine (LIDODERM) 5 % Place 1 patch onto the skin daily. Remove & Discard patch within 12 hours or as directed by MD 30 patch 0  ? losartan (COZAAR) 25 MG tablet Take 1 tablet (25 mg total) by mouth daily. 30 tablet 3  ? oxyCODONE (OXY IR/ROXICODONE) 5 MG immediate release tablet Take 2 tablets (10 mg total) by mouth every 4 (four) hours as needed for severe pain. 30 tablet 0  ? potassium chloride SA (KLOR-CON M) 20 MEQ tablet Take 1 tablet (20 mEq total) by mouth daily. 7 tablet 0  ? torsemide (DEMADEX) 20 MG tablet Take 1 tablet (20 mg total) by mouth daily. 7 tablet 0  ? ?No current facility-administered medications for this visit.  ? ? ?Physical Exam ?Vital signs: ? ?BP: 112/65 ?Pulse 65 ?Respiratory rate 20 ?  SPO2 95% ? ?Heart: Regular rate and rhythm ?Chest: Breath sounds are clear to auscultation.  Sternotomy incision is healing no sign of complication. ?Extremities: There is 2+ edema from the right knee down to the ankle.  There is erythema along the majority of the medial aspect of his right lower leg.  This whole area is mildly tender.  There is a few drops of watery drainage coming from the [facility] stab incision near the ankle. ? ? ? ?Diagnostic Tests: ? ?CLINICAL DATA:  s/p CABG ?  ?EXAM: ?CHEST - 2 VIEW ?  ?COMPARISON:  January 26, 2022 ?  ?FINDINGS: ?Stable appearance of the mediastinal silhouette. Status post median ?sternotomy. Similar left lower lung retrocardiac opacity.  Small ?right pleural effusion. Right clavicle status post internal ?fixation. ?  ?IMPRESSION: ?1. Similar left lower lung/retrocardiac opacity, atelectasis versus ?infiltrate. ?2. Small right pleural effusion. ?  ?  ?Electronically Signed ?  By: Olive Bass M.D. ?  On: 01/31/2022 13:47 ? ? ?Impression / Plan: ? ?-Cellulitis in the right lower extremity in this gentleman who is now 2 weeks post CABG x4 following out of hospital cardiac arrest.  He has been treated with Keflex for 4 days without any significant improvement.  He would like to continue managing this on outpatient basis.  He has not been febrile and otherwise feels well and is anxious to advance his activity.  I explained to patient principles of managing cellulitis with offloading tension by keeping the extremity elevated and we will also increase his torsemide to twice daily for the next 5 days.  I asked him to apply warm compresses for at least 20 minutes 3-4 times a day.  We will change his antibiotics to Septra DS since this will cover many of the methicillin-resistant staph variants.  We will plan for a virtual visit on Thursday (2 days from now) to check on his progress.  I explained that if the cellulitis is not improving will likely need to admit him for IV antibiotics. ? ?-Postoperative atrial fibrillation-appears to be in sinus rhythm today.  Continue the amiodarone at 200 mg once daily. ? ?-Postoperative bradycardia-resolved, he was tolerating the carvedilol at 3.125 mg p.o. twice daily ? ? ?Leary Roca, PA-C ?[facility] ?[phone] ? ? ? ? ?

## 2022-02-02 ENCOUNTER — Telehealth (HOSPITAL_COMMUNITY): Payer: Self-pay

## 2022-02-02 ENCOUNTER — Telehealth: Payer: Self-pay | Admitting: Physician Assistant

## 2022-02-02 NOTE — Telephone Encounter (Signed)
Transitions of Care Pharmacy   Call attempted for a pharmacy transitions of care follow-up. HIPAA appropriate voicemail was left with call back information provided.   Call attempt #2. Will follow-up in 2-3 days.    

## 2022-02-02 NOTE — Telephone Encounter (Signed)
? ?   ?  [address] ?      [place] [address] ?            [phone]   ? ?  ?I spoke with Jeremy Winters by phone this morning regarding the RLE cellulitis. Symptoms are overall improving with less swelling and less erythema. Still having some soreness when the extremity is in a dependent position or he is ambulating.  Denies fever or drainage. Plan to continue with the Bactrim DS and Torsemide BID, warm compresses, and elevation.  ?He is living in [place] but will be coming to [place] on Monday 3/20 for a cardiology appointment. I will plan to see him in our office for follow up after that appointment. ? ?Jillyn Hidden, PA-C ?

## 2022-02-03 ENCOUNTER — Telehealth (HOSPITAL_COMMUNITY): Payer: Self-pay

## 2022-02-03 NOTE — Telephone Encounter (Signed)
Transitions of Care Pharmacy   Call attempted for a pharmacy transitions of care follow-up. HIPAA appropriate voicemail was left with call back information provided.   Call attempt #2. Will follow-up in 2-3 days.    

## 2022-02-06 ENCOUNTER — Telehealth (HOSPITAL_COMMUNITY): Payer: Self-pay

## 2022-02-06 ENCOUNTER — Other Ambulatory Visit: Payer: Self-pay

## 2022-02-06 ENCOUNTER — Ambulatory Visit (INDEPENDENT_AMBULATORY_CARE_PROVIDER_SITE_OTHER): Payer: Self-pay | Admitting: Physician Assistant

## 2022-02-06 ENCOUNTER — Encounter: Payer: Self-pay | Admitting: Physician Assistant

## 2022-02-06 ENCOUNTER — Ambulatory Visit (INDEPENDENT_AMBULATORY_CARE_PROVIDER_SITE_OTHER): Payer: 59 | Admitting: Physician Assistant

## 2022-02-06 VITALS — BP 115/78 | HR 67 | Temp 97.7°F | Resp 20 | Ht 69.0 in | Wt 184.0 lb

## 2022-02-06 VITALS — BP 108/64 | HR 79 | Ht 69.0 in | Wt 184.0 lb

## 2022-02-06 DIAGNOSIS — E871 Hypo-osmolality and hyponatremia: Secondary | ICD-10-CM

## 2022-02-06 DIAGNOSIS — I25810 Atherosclerosis of coronary artery bypass graft(s) without angina pectoris: Secondary | ICD-10-CM

## 2022-02-06 DIAGNOSIS — R600 Localized edema: Secondary | ICD-10-CM

## 2022-02-06 DIAGNOSIS — L03115 Cellulitis of right lower limb: Secondary | ICD-10-CM

## 2022-02-06 DIAGNOSIS — Z951 Presence of aortocoronary bypass graft: Secondary | ICD-10-CM

## 2022-02-06 DIAGNOSIS — D649 Anemia, unspecified: Secondary | ICD-10-CM

## 2022-02-06 DIAGNOSIS — E785 Hyperlipidemia, unspecified: Secondary | ICD-10-CM | POA: Diagnosis not present

## 2022-02-06 NOTE — Patient Instructions (Signed)
Medication Instructions:  ?Your physician recommends that you continue on your current medications as directed. Please refer to the Current Medication list given to you today. ? ?*If you need a refill on your cardiac medications before your next appointment, please call your pharmacy* ? ?Lab Work: ?Your physician recommends that you return for lab work in 2-4 WEEKS:  ?CBC ?CMET ?Fasting Lipid Panel-DO NOT EAT OR DRINK PAST MIDNIGHT. OKAY TO HAVE WATER TO DRINK AND/OR BLACK COFFEE.  ?If you have labs (blood work) drawn today and your tests are completely normal, you will receive your results only by: ?MyChart Message (if you have MyChart) OR ?A paper copy in the mail ?If you have any lab test that is abnormal or we need to change your treatment, we will call you to review the results. ? ?Testing/Procedures: ?NONE ordered at this time of appointment  ? ?Follow-Up: ?At [facility], you and your health needs are our priority.  As part of our continuing mission to provide you with exceptional heart care, we have created designated Provider Care Teams.  These Care Teams include your primary Cardiologist (physician) and Advanced Practice Providers (APPs -  Physician Assistants and Nurse Practitioners) who all work together to provide you with the care you need, when you need it. ? ?We recommend signing up for the patient portal called "MyChart".  Sign up information is provided on this After Visit Summary.  MyChart is used to connect with patients for Virtual Visits (Telemedicine).  Patients are able to view lab/test results, encounter notes, upcoming appointments, etc.  Non-urgent messages can be sent to your provider as well.   ?To learn more about what you can do with MyChart, go to [email or web address].   ? ?Your next appointment:   ?1 month(s) ? ?The format for your next appointment:   ?In Person ? ?Provider:   ?Azalee Course, PA-C    Then, Peter [place], MD will plan to see you again in 3 month(s).  ? ?Other  Instructions ? ? ?

## 2022-02-06 NOTE — Progress Notes (Signed)
?Cardiology Office Note:   ? ?Date:  02/08/2022  ? ?ID:  Jeremy Winters, DOB [birth year], MRN [id number] ? ?PCP:  Patient, No Pcp Per (Inactive) ?  ?[facility] Providers ?Cardiologist:  Peter Martinique, MD    ? ?Referring MD: No ref. provider found  ? ?Chief Complaint  ?Patient presents with  ? Follow-up  ?  CABG, cardiac arrest.  ? ? ?History of Present Illness:   ? ?Jeremy Winters is a 56 y.o. male with a hx of hyperlipidemia and CAD s/p CABG.  He presented to [facility] after working out at [employer] and had a witnessed arrest.  He was placed on IV amiodarone in the ED which later transitioned to p.o.  Given the exertional component of the cardiac arrest, the patient was immediately taken to the Cath Lab on 01/11/2022 which revealed a 90% distal LAD lesion, 30% mid to distal left main, 100% proximal to mid left circumflex occlusion, 60% proximal to mid RCA lesion, 80% distal RCA lesion, EF 35 to 40%.  It was felt the patient likely had a cardiac arrest due to demand supply mismatch doing heavy exercise.  Postprocedure, he was placed on pressors which was weaned off over the course of next 2 days.  Postprocedure, troponin trended up to 9472.  Echocardiogram obtained on 01/12/2022 demonstrated EF of 55 to 60%, flattened interventricular septum in diastole suggestive of right ventricular volume overload, grade 2 DD, RVSP 45 mmHg.  Initial plan was to refer the patient to [facility] for CABG, however patient and his wife preferred to have the surgery done in the hospital.  Patient ultimately underwent CABG x4 with LIMA-LAD, SVG-OM1, SVG-OM 2, and SVG-PDA.  Patient was initially started on low-dose metoprolol after surgery, however had significant bradycardia, therefore metoprolol was discontinued.  He was started on Cozaar due to hypertension.  He had issues with insomnia which responded to trazodone.  He did receive a dose of metolazone due to volume overload.  Due to pleural effusion, he  underwent thoracentesis with removal of 450 cc of fluid.  Prior to discharge, carvedilol 3.125 mg twice a day was added to his medical regimen.  He was discharged on aspirin and Plavix.  Amiodarone de-escalated to 200 mg daily.  He did have increased oxygen requirement during ambulation and will require oxygen therapy at home. ? ?Patient presents today for posthospital follow-up along with his wife.  He denied any chest pain or worsening dyspnea.  EKG does show T wave inversion in lead I and aVL, however patient has no exertional symptoms.  He has some degree of insomnia.  Since discharge, he had cellulitis in the distal right leg and has been treated with 2 course of antibiotic.  Due to lower extremity edema, he was placed on 10-day course of torsemide and potassium.  Blood pressure stable on the current therapy.  There is no sign of bradycardia on carvedilol.  I recommended 1 month follow-up.  Prior to that point, she will need a CMP, CBC and fasting lipid panel in the next 2 to 4 weeks to check renal function, electrolyte and red blood cell count.  I have printed out his echo, cath report, and surgical report for him to take his to his new cardiologist at [facility] as he is in the process of moving to [place].  We will tentatively schedule him a 1 month follow-up with me in the 54-month follow-up with Dr. Martinique. ? ? ?Past Medical History:  ?Diagnosis Date  ?  Hyperlipidemia   ? ? ?Past Surgical History:  ?Procedure Laterality Date  ? CLOSED REDUCTION CLAVICLE FRACTURE Right   ? fixed with hardware  ? CORONARY ARTERY BYPASS GRAFT N/A 01/18/2022  ? Procedure: CORONARY ARTERY BYPASS GRAFTING (CABG), ON PUMP, TIMES FOUR, USING LEFT INTERNAL MAMMARY ARTERY AND RIGHT ENDOSCOPICALLY HARVESTED GREATER SAPHENOUS VEIN;  Surgeon: Dahlia Byes, MD;  Location: [place];  Service: Open Heart Surgery;  Laterality: N/A;  ? IR THORACENTESIS ASP PLEURAL SPACE W/IMG GUIDE  01/25/2022  ? RIGHT/LEFT HEART CATH AND CORONARY ANGIOGRAPHY  N/A 01/12/2022  ? Procedure: RIGHT/LEFT HEART CATH AND CORONARY ANGIOGRAPHY;  Surgeon: Martinique, Peter M, MD;  Location: [place] [facility];  Service: Cardiovascular;  Laterality: N/A;  ? stem cell injection knee Bilateral   ? TEE WITHOUT CARDIOVERSION N/A 01/18/2022  ? Procedure: TRANSESOPHAGEAL ECHOCARDIOGRAM (TEE);  Surgeon: Dahlia Byes, MD;  Location: [place];  Service: Open Heart Surgery;  Laterality: N/A;  ? ? ?Current Medications: ?Current Meds  ?Medication Sig  ? acetaminophen (TYLENOL) 325 MG tablet Take 2 tablets (650 mg total) by mouth every 6 (six) hours as needed for mild pain.  ? amiodarone (PACERONE) 200 MG tablet Take 1 tablet (200 mg total) by mouth daily.  ? aspirin 81 MG EC tablet Take 1 tablet (81 mg total) by mouth daily. Swallow whole.  ? atorvastatin (LIPITOR) 80 MG tablet Take 1 tablet (80 mg total) by mouth daily.  ? carvedilol (COREG) 3.125 MG tablet Take 1 tablet (3.125 mg total) by mouth 2 (two) times daily with a meal.  ? clopidogrel (PLAVIX) 75 MG tablet Take 1 tablet (75 mg total) by mouth daily.  ? losartan (COZAAR) 25 MG tablet Take 1 tablet (25 mg total) by mouth daily.  ? [EXPIRED] oxyCODONE (OXY IR/ROXICODONE) 5 MG immediate release tablet Take 1 tablet (5 mg total) by mouth every 4 (four) hours as needed for up to 7 days for severe pain.  ? sulfamethoxazole-trimethoprim (BACTRIM DS) 800-160 MG tablet Take 1 tablet by mouth 2 (two) times daily.  ? [DISCONTINUED] cephALEXin (KEFLEX) 500 MG capsule Take 1 capsule (500 mg total) by mouth 3 (three) times daily.  ? [DISCONTINUED] lidocaine (LIDODERM) 5 % Place 1 patch onto the skin daily. Remove & Discard patch within 12 hours or as directed by MD  ?  ? ?Allergies:   Patient has no known allergies.  ? ?Social History  ? ?Socioeconomic History  ? Marital status: Married  ?  Spouse name: Not on file  ? Number of children: Not on file  ? Years of education: Not on file  ? Highest education level: Not on file  ?Occupational History  ? Not on  file  ?Tobacco Use  ? Smoking status: Never  ? Smokeless tobacco: Not on file  ?Substance and Sexual Activity  ? Alcohol use: Yes  ?  Comment: socially  ? Drug use: Never  ? Sexual activity: Not on file  ?Other Topics Concern  ? Not on file  ?Social History Narrative  ? Not on file  ? ?Social Determinants of Health  ? ?Financial Resource Strain: Not on file  ?Food Insecurity: Not on file  ?Transportation Needs: Not on file  ?Physical Activity: Not on file  ?Stress: Not on file  ?Social Connections: Not on file  ?  ? ?Family History: ?The patient's family history includes Heart attack in his cousin. ? ?ROS:   ?Please see the history of present illness.    ? All other systems reviewed and  are negative. ? ?EKGs/Labs/Other Studies Reviewed:   ? ?The following studies were reviewed today: ? ?Cath 01/12/2022 ?  Mid LM to Dist LM lesion is 30% stenosed. ?  Dist LAD lesion is 90% stenosed. ?  Prox Cx to Mid Cx lesion is 100% stenosed. ?  Prox RCA to Mid RCA lesion is 60% stenosed. ?  Dist RCA lesion is 80% stenosed. ?  The left ventricular ejection fraction is 35-45% by visual estimate. ?  ?3 vessel obstructive CAD. There is 90% stenosis in the distal LAD. The LCX is a moderately large vessel with 100% mid vessel CTO with well established collaterals. The RCA has an 80% distal lesion. ?LV function is mild to moderately reduced but may not represent true function since patient was significantly tachycardic at the time.  ?Low/normal LV filling pressures with PCWP mean of 7.  ?Normal right heart pressures. ?Low cardiac output with Index 1.65.  ?  ?Plan: his cardiac arrest appears to be related to demand ischemia ( walking on treadmill in presence of chronic severe CAD). Recommend medical therapy for now. With recovery of neurologic function we can decide ultimate revascularization strategy. Will continue IV amiodarone. Resume Heparin in 8 hours post sheath removal. ASA and statin therapy. CCM to manage vent and critical care  issues.  ? ?EKG:  EKG is ordered today.  The ekg ordered today demonstrates normal sinus rhythm, left anterior fascicular block, T wave inversion in lead I and aVL ? ?Recent Labs: ?01/17/2022: TSH 6.308 ?01/19/19

## 2022-02-06 NOTE — Progress Notes (Signed)
? ?   ?[address] ?      Jeremy Winters [id number] ?            [phone]   ? ?  ? ?HPI: ? ?Patient returns for scheduled postoperative follow-up having undergone CABG x4 by Dr. Donata Clay on 01/18/2022.  He had been brought to the hospital by EMS following an out of hospital cardiac arrest.  He collapsed while exercising on the treadmill at [employer].  He received CPR for 18 minutes and was defibrillated successfully.  He arrived at the hospital palpable pulse.  Non-ST elevation function.  High-sensitivity troponin peaked at 9000. ?The patient's early postoperative recovery while in the hospital was notable for bradycardia early postop.  For this reason, beta-blockers were held for several days.  He developed atrial fibrillation that was managed with amiodarone and successfully converted back to sinus rhythm.  He was in sinus rhythm and had an acceptable heart rate,  we added low-dose carvedilol.  His postoperative course was also significant for persistent hypoxia.  He had a small left pleural effusion that was drained by the interventional radiology team few days prior to discharge with 450 mL fluid removed.  He was discharged on 01/26/2022 with home health and home oxygen. ?He developed superficial cellulitis in his right lower extremity shortly after discharge to home.  He was seen in office last week after being started on oral Keflex by the on-call physician a few days earlier.  At the time of his office visit last week, he had not had any significant improvement in the erythema, tenderness, or swelling since starting the antibiotics about 3 days prior.  This reason, the antibiotic was switched to Bactrim DS twice daily.  He was asked to increase his torsemide to 20 mg p.o. twice daily in addition to keeping the extremity elevated and applying warm compresses for 20 minutes 3 times daily. ?He returns today for a wound check.  Overall, he reports she is continuing to improve.  She is having  less pain, redness, and swelling.  He feels he is getting stronger in general.  He denies having any fever.  He has not had any significant drainage from the right medial knee incision or stab incision at the ankle. ? ? ?Current Outpatient Medications  ?Medication Sig Dispense Refill  ? acetaminophen (TYLENOL) 325 MG tablet Take 2 tablets (650 mg total) by mouth every 6 (six) hours as needed for mild pain.    ? amiodarone (PACERONE) 200 MG tablet Take 1 tablet (200 mg total) by mouth daily. 30 tablet 1  ? aspirin 81 MG EC tablet Take 1 tablet (81 mg total) by mouth daily. Swallow whole. 30 tablet 11  ? atorvastatin (LIPITOR) 80 MG tablet Take 1 tablet (80 mg total) by mouth daily. 30 tablet 3  ? carvedilol (COREG) 3.125 MG tablet Take 1 tablet (3.125 mg total) by mouth 2 (two) times daily with a meal. 60 tablet 3  ? clopidogrel (PLAVIX) 75 MG tablet Take 1 tablet (75 mg total) by mouth daily. 30 tablet 3  ? losartan (COZAAR) 25 MG tablet Take 1 tablet (25 mg total) by mouth daily. 30 tablet 3  ? oxyCODONE (OXY IR/ROXICODONE) 5 MG immediate release tablet Take 1 tablet (5 mg total) by mouth every 4 (four) hours as needed for up to 7 days for severe pain. 30 tablet 0  ? potassium chloride SA (KLOR-CON M) 20 MEQ tablet Take 1 tablet (20 mEq total) by mouth  2 (two) times daily. 10 tablet 0  ? sulfamethoxazole-trimethoprim (BACTRIM DS) 800-160 MG tablet Take 1 tablet by mouth 2 (two) times daily. 20 tablet 0  ? torsemide (DEMADEX) 20 MG tablet Take 1 tablet (20 mg total) by mouth 2 (two) times daily. 10 tablet 0  ? ?No current facility-administered medications for this visit.  ? ? ?Physical Exam ?Vital signs ?BP 115/78 ?Pulse 67 ?Respirations 20 ?Pitcher 97.7 ?F ? ?Heart: Remains in regular rate and rhythm.  No murmur. ?Chest: Breath sounds are full, equal, and clear to auscultation.  Sternotomy incision and chest tube sites are healing nicely with no sign of complication. ?Extremities: No peripheral edema.  The erythema  along the medial right lower extremity is almost completely cleared.  There continues to be some induration along the tract of the [facility] tunnel.  I cannot palpate any retained fluid at any point along the tunnel. ? ?Diagnostic Tests: None today ? ? ?Impression / Plan: Jeremy Winters is continuing to recover following CABG x4 by Dr. Donata Clay about 3 weeks ago after he presented with an out of hospital cardiac arrest.  He continues to have some generalized weakness which is expected.  He notes that he is progressing with his endurance and strength gradually.  He has not used any supplemental oxygen for a full week and asked that we discontinue that service. ?His peripheral edema has resolved and I believe he can stop the Demadex and potassium at this point. He should continue with the clopidogrel, losartan, amiodarone, and atorvastatin as prescribed.  ? ?The RLE cellulitis is resolving.  He has another 4 days to complete the 10-day course of of the Septra DS. I asked him to continue with the elevation when not mobile and continue the warm compresses for the remainder of this week.  We will plan to follow-up with him in the office in 1 week with a chest x-ray. ? ?Jeremy Winters is in the process of permanently relocating to [place]. ? ? ?Leary Roca, PA-C ?[facility] ?[phone] ? ? ? ? ?

## 2022-02-06 NOTE — Telephone Encounter (Signed)
Transitions of Care Pharmacy   Call attempted for a pharmacy transitions of care follow-up. HIPAA appropriate voicemail was left with call back information provided.   Call attempt #3. Will no longer attempt follow up for TOC pharmacy.   

## 2022-02-06 NOTE — Patient Instructions (Signed)
Continue the Septra DS to complete the 10-day course. ? ?Stop taking torsemide and potassium supplement. ? ?May advance activity as desired while observing sternal precautions. ? ?Continue elevating the right lower extremity when not mobile. ? ?Continue warm compresses to the right lower extremity for the remainder of this week. ? ?Follow-up in 1 week as scheduled ?

## 2022-02-10 ENCOUNTER — Other Ambulatory Visit: Payer: Self-pay | Admitting: Cardiothoracic Surgery

## 2022-02-10 DIAGNOSIS — Z951 Presence of aortocoronary bypass graft: Secondary | ICD-10-CM

## 2022-02-13 ENCOUNTER — Ambulatory Visit: Payer: Self-pay | Admitting: Cardiothoracic Surgery

## 2022-02-15 ENCOUNTER — Other Ambulatory Visit (HOSPITAL_COMMUNITY): Payer: Self-pay

## 2022-02-20 ENCOUNTER — Ambulatory Visit: Payer: Self-pay | Admitting: Cardiothoracic Surgery

## 2022-02-23 ENCOUNTER — Ambulatory Visit (INDEPENDENT_AMBULATORY_CARE_PROVIDER_SITE_OTHER): Payer: Self-pay | Admitting: Cardiothoracic Surgery

## 2022-02-23 ENCOUNTER — Ambulatory Visit
Admission: RE | Admit: 2022-02-23 | Discharge: 2022-02-23 | Disposition: A | Discharge status: Home / Self Care | Payer: 59 | Source: Ambulatory Visit | Attending: Cardiothoracic Surgery | Admitting: Cardiothoracic Surgery

## 2022-02-23 ENCOUNTER — Encounter: Payer: Self-pay | Admitting: Cardiothoracic Surgery

## 2022-02-23 VITALS — BP 114/77 | HR 75 | Resp 20 | Ht 69.0 in | Wt 184.0 lb

## 2022-02-23 DIAGNOSIS — Z951 Presence of aortocoronary bypass graft: Secondary | ICD-10-CM

## 2022-02-23 DIAGNOSIS — I2510 Atherosclerotic heart disease of native coronary artery without angina pectoris: Secondary | ICD-10-CM | POA: Insufficient documentation

## 2022-02-23 DIAGNOSIS — I2583 Coronary atherosclerosis due to lipid rich plaque: Secondary | ICD-10-CM

## 2022-02-23 DIAGNOSIS — L03115 Cellulitis of right lower limb: Secondary | ICD-10-CM

## 2022-02-23 NOTE — Progress Notes (Signed)
?  HPI: ?Patient returns for routine postoperative follow-up having undergone coronary artery bypass grafting x4 on January 18, 2022.  Presented with out of hospital V-fib arrest.  He had severe multivessel CAD. ?The patient's early postoperative recovery while in the hospital was notable for gradual recovery and improvement.  He was discharged home on carvedilol and Crestor and amiodarone.Marland Kitchen ?Since hospital discharge the patient reports he has moved with his wife to [facility] and will establish care with cardiology there later this month.  He has been walking up to 5 miles a day and is improving his strength and exercise tolerance.  The surgical incisions are healing well.  The cellulitis in his right leg vein harvest site has resolved.. ? ? ?Current Outpatient Medications  ?Medication Sig Dispense Refill  ? acetaminophen (TYLENOL) 325 MG tablet Take 2 tablets (650 mg total) by mouth every 6 (six) hours as needed for mild pain.    ? amiodarone (PACERONE) 200 MG tablet Take 1 tablet (200 mg total) by mouth daily. 30 tablet 1  ? aspirin 81 MG EC tablet Take 1 tablet (81 mg total) by mouth daily. Swallow whole. 30 tablet 11  ? atorvastatin (LIPITOR) 80 MG tablet Take 1 tablet (80 mg total) by mouth daily. 30 tablet 3  ? carvedilol (COREG) 3.125 MG tablet Take 1 tablet (3.125 mg total) by mouth 2 (two) times daily with a meal. 60 tablet 3  ? clopidogrel (PLAVIX) 75 MG tablet Take 1 tablet (75 mg total) by mouth daily. 30 tablet 3  ? losartan (COZAAR) 25 MG tablet Take 1 tablet (25 mg total) by mouth daily. 30 tablet 3  ? sulfamethoxazole-trimethoprim (BACTRIM DS) 800-160 MG tablet Take 1 tablet by mouth 2 (two) times daily. (Patient not taking: Reported on 02/23/2022) 20 tablet 0  ? ?No current facility-administered medications for this visit.  ?Blood pressure 114/77, pulse 75, resp. rate 20, height 5\' 9"  (1.753 m), weight 184 lb (83.5 kg), SpO2 97 %.  ? ? ? ?Physical Exam: ? ?Alert and comfortable ?Lungs clear ?Sternal  incision well-healed ?Heart rate regular without murmur or gallop ?Right leg was still mild edema but no cellulitis ?Neuro intact ? ?Diagnostic Tests: ?Chest x-ray shows resolution of edema and pleural effusions.  Sternal wires intact ? ?Impression: ?Patient doing very well now 6 weeks after urgent multivessel CAD after presentation with V-fib arrest and moderate LV dysfunction.  Incisions are healed.  He may now drive and lift up to 20 pounds but no strenuous exercise or heavy lifting until June 1.  Continue current medications until he establishes care with cardiology in [place]. ? ?Plan: ?Return here as needed. ? ? ?[place], MD ?[facility] ?[phone] ? ?

## 2022-03-15 ENCOUNTER — Encounter: Payer: Self-pay | Admitting: Physician Assistant

## 2022-03-15 ENCOUNTER — Ambulatory Visit (INDEPENDENT_AMBULATORY_CARE_PROVIDER_SITE_OTHER): Payer: 59 | Admitting: Physician Assistant

## 2022-03-15 VITALS — BP 103/62 | HR 62 | Ht 70.0 in | Wt 191.4 lb

## 2022-03-15 DIAGNOSIS — E785 Hyperlipidemia, unspecified: Secondary | ICD-10-CM | POA: Diagnosis not present

## 2022-03-15 DIAGNOSIS — Z9229 Personal history of other drug therapy: Secondary | ICD-10-CM

## 2022-03-15 DIAGNOSIS — D649 Anemia, unspecified: Secondary | ICD-10-CM

## 2022-03-15 DIAGNOSIS — R600 Localized edema: Secondary | ICD-10-CM

## 2022-03-15 DIAGNOSIS — I472 Ventricular tachycardia, unspecified: Secondary | ICD-10-CM

## 2022-03-15 DIAGNOSIS — E871 Hypo-osmolality and hyponatremia: Secondary | ICD-10-CM

## 2022-03-15 DIAGNOSIS — I25810 Atherosclerosis of coronary artery bypass graft(s) without angina pectoris: Secondary | ICD-10-CM

## 2022-03-15 NOTE — Progress Notes (Addendum)
?Cardiology Office Note:   ? ?Date:  03/15/2022  ? ?ID:  Jeremy Winters, DOB Dec 16, 1965, MRN [id number] ? ?PCP:  Patient, No Pcp Per (Inactive) ?  ?[facility] Providers ?Cardiologist:  Peter Martinique, MD   --> plan to switch to [facility] when he moves to [facility] ? ?Referring MD: No ref. provider found  ? ?Chief Complaint  ?Patient presents with  ? Follow-up  ?  Seen for Dr. Martinique, recent CABG  ? ? ?History of Present Illness:   ? ?Jeremy Winters is a 56 y.o. male with a hx of hyperlipidemia and CAD s/p CABG.  He presented to [facility] after working out at [employer] and had a witnessed arrest.  He was placed on IV amiodarone in the ED which later transitioned to p.o.  Given the exertional component of the cardiac arrest, the patient was immediately taken to the Cath Lab on 01/11/2022 which revealed a 90% distal LAD lesion, 30% mid to distal left main, 100% proximal to mid left circumflex occlusion, 60% proximal to mid RCA lesion, 80% distal RCA lesion, EF 35 to 40%.  It was felt the patient likely had a cardiac arrest due to demand supply mismatch doing heavy exercise.  Postprocedure, he was placed on pressors which was weaned off over the course of next 2 days.  Postprocedure, high sensitivity troponin trended up to 9472.  Echocardiogram obtained on 01/12/2022 demonstrated EF of 55 to 60%, flattened interventricular septum in diastole suggestive of right ventricular volume overload, grade 2 DD, RVSP 45 mmHg.  Initial plan was to refer the patient to [facility] for CABG, however patient and his wife preferred to have the surgery done in the hospital.  Patient ultimately underwent CABG x4 with LIMA-LAD, SVG-OM1, SVG-OM 2, and SVG-PDA.  Patient was initially started on low-dose metoprolol after surgery, however had significant bradycardia, therefore metoprolol was discontinued.  He was started on losartan due to hypertension.  He had issues with insomnia which responded to trazodone.  He did  receive a dose of metolazone due to volume overload.  Due to left pleural effusion, he underwent thoracentesis with removal of 450 cc of fluid.  Prior to discharge, carvedilol 3.125 mg twice a day was added to his medical regimen.  He was discharged on aspirin and Plavix.  Amiodarone de-escalated to 200 mg daily.  ? ?I last saw the patient on 02/06/2022 at which time he denied any chest pain.  EKG at the time showed T wave inversion in lead I and aVL, however patient had no exertional symptoms.  He did have some degree of insomnia.  Since hospital discharge, he had a cellulitis in the distal right leg and was treated with 2 course of antibiotic.  Due to lower extremity edema, he was placed on 10-day course of torsemide and potassium. ? ?Patient presents today for cardiology follow-up.  He is no longer on any diuretic or potassium supplement.  Lower extremity edema has completely resolved.  There is no sign of cellulitis in the lower extremity as well.  On physical exam, he appears to still have trace amount of left pleural effusion.  Otherwise, he denies any significant chest discomfort or worsening dyspnea.  This small pleural effusion likely will resolve by itself.  He is still on amiodarone therapy.  I discussed the case with Dr. Peter Martinique who recommended continue on amiodarone for now.  Otherwise, patient is doing quite well since the bypass surgery and is now able to walk 4 to  5 miles per day.  He has established with a cardiologist at [facility], next appointment is on June 26, we will cancel his follow-up with Dr. Martinique scheduled on June 28.  I previously gave him copy of his cath report, echocardiogram and bypass surgical report.  Cardiothoracic surgery service has given him 3 months off from work, after which time he should be able to resume work without further restriction. ? ? ?Past Medical History:  ?Diagnosis Date  ? Hyperlipidemia   ? ? ?Past Surgical History:  ?Procedure Laterality Date  ? CLOSED  REDUCTION CLAVICLE FRACTURE Right   ? fixed with hardware  ? CORONARY ARTERY BYPASS GRAFT N/A 01/18/2022  ? Procedure: CORONARY ARTERY BYPASS GRAFTING (CABG), ON PUMP, TIMES FOUR, USING LEFT INTERNAL MAMMARY ARTERY AND RIGHT ENDOSCOPICALLY HARVESTED GREATER SAPHENOUS VEIN;  Surgeon: Dahlia Byes, MD;  Location: [facility];  Service: Open Heart Surgery;  Laterality: N/A;  ? IR THORACENTESIS ASP PLEURAL SPACE W/IMG GUIDE  01/25/2022  ? RIGHT/LEFT HEART CATH AND CORONARY ANGIOGRAPHY N/A 01/12/2022  ? Procedure: RIGHT/LEFT HEART CATH AND CORONARY ANGIOGRAPHY;  Surgeon: Martinique, Peter M, MD;  Location: [facility];  Service: Cardiovascular;  Laterality: N/A;  ? stem cell injection knee Bilateral   ? TEE WITHOUT CARDIOVERSION N/A 01/18/2022  ? Procedure: TRANSESOPHAGEAL ECHOCARDIOGRAM (TEE);  Surgeon: Dahlia Byes, MD;  Location: [place];  Service: Open Heart Surgery;  Laterality: N/A;  ? ? ?Current Medications: ?Current Meds  ?Medication Sig  ? acetaminophen (TYLENOL) 325 MG tablet Take 2 tablets (650 mg total) by mouth every 6 (six) hours as needed for mild pain.  ? amiodarone (PACERONE) 200 MG tablet Take 1 tablet (200 mg total) by mouth daily.  ? aspirin 81 MG EC tablet Take 1 tablet (81 mg total) by mouth daily. Swallow whole.  ? atorvastatin (LIPITOR) 80 MG tablet Take 1 tablet (80 mg total) by mouth daily.  ? carvedilol (COREG) 3.125 MG tablet Take 1 tablet (3.125 mg total) by mouth 2 (two) times daily with a meal.  ? clopidogrel (PLAVIX) 75 MG tablet Take 1 tablet (75 mg total) by mouth daily.  ? losartan (COZAAR) 25 MG tablet Take 1 tablet (25 mg total) by mouth daily.  ?  ? ?Allergies:   Patient has no known allergies.  ? ?Social History  ? ?Socioeconomic History  ? Marital status: Married  ?  Spouse name: Not on file  ? Number of children: Not on file  ? Years of education: Not on file  ? Highest education level: Not on file  ?Occupational History  ? Not on file  ?Tobacco Use  ? Smoking status: Never  ? Smokeless  tobacco: Not on file  ?Substance and Sexual Activity  ? Alcohol use: Yes  ?  Comment: socially  ? Drug use: Never  ? Sexual activity: Not on file  ?Other Topics Concern  ? Not on file  ?Social History Narrative  ? Not on file  ? ?Social Determinants of Health  ? ?Financial Resource Strain: Not on file  ?Food Insecurity: Not on file  ?Transportation Needs: Not on file  ?Physical Activity: Not on file  ?Stress: Not on file  ?Social Connections: Not on file  ?  ? ?Family History: ?The patient's family history includes Heart attack in his cousin. ? ?ROS:   ?Please see the history of present illness.    ? All other systems reviewed and are negative. ? ?EKGs/Labs/Other Studies Reviewed:   ? ?The following studies were reviewed  today: ? ?Cath 01/12/2022 ?  Mid LM to Dist LM lesion is 30% stenosed. ?  Dist LAD lesion is 90% stenosed. ?  Prox Cx to Mid Cx lesion is 100% stenosed. ?  Prox RCA to Mid RCA lesion is 60% stenosed. ?  Dist RCA lesion is 80% stenosed. ?  The left ventricular ejection fraction is 35-45% by visual estimate. ?  ?3 vessel obstructive CAD. There is 90% stenosis in the distal LAD. The LCX is a moderately large vessel with 100% mid vessel CTO with well established collaterals. The RCA has an 80% distal lesion. ?LV function is mild to moderately reduced but may not represent true function since patient was significantly tachycardic at the time.  ?Low/normal LV filling pressures with PCWP mean of 7.  ?Normal right heart pressures. ?Low cardiac output with Index 1.65.  ?  ?Plan: his cardiac arrest appears to be related to demand ischemia ( walking on treadmill in presence of chronic severe CAD). Recommend medical therapy for now. With recovery of neurologic function we can decide ultimate revascularization strategy. Will continue IV amiodarone. Resume Heparin in 8 hours post sheath removal. ASA and statin therapy. CCM to manage vent and critical care issues.  ?  ? ?Echo 01/13/2022 ? 1. 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  ?Left ventricular ejection fraction, by estimation, is 55 to 60%. The left  ?ventricle has normal function. The left ventricle has no regional wall  ?mo

## 2022-03-15 NOTE — Patient Instructions (Signed)
Medication Instructions:  ?Your physician recommends that you continue on your current medications as directed. Please refer to the Current Medication list given to you today. ? ?*If you need a refill on your cardiac medications before your next appointment, please call your pharmacy* ? ? ?Lab Work: ?Your physician recommends that you return for lab work TODAY:  ?TSH  ?Previously ordered labs: ?CBC ?CMP ?FLP ?If you have labs (blood work) drawn today and your tests are completely normal, you will receive your results only by: ?MyChart Message (if you have MyChart) OR ?A paper copy in the mail ?If you have any lab test that is abnormal or we need to change your treatment, we will call you to review the results. ? ? ?Testing/Procedures: ?NONE ordered at this time of appointment  ? ?Follow-Up: ?At [facility], you and your health needs are our priority.  As part of our continuing mission to provide you with exceptional heart care, we have created designated Provider Care Teams.  These Care Teams include your primary Cardiologist (physician) and Advanced Practice Providers (APPs -  Physician Assistants and Nurse Practitioners) who all work together to provide you with the care you need, when you need it. ? ?Your next appointment:   ?Follow up with New Cardiologist as scheduled   ? ?The format for your next appointment:   ? ? ?Provider:    ? ? ?Other Instructions ? ? ?Important Information About Sugar ? ? ? ? ? ? ?

## 2022-03-16 LAB — COMPREHENSIVE METABOLIC PANEL
ALT: 27 IU/L (ref 0–44)
AST: 24 IU/L (ref 0–40)
Albumin/Globulin Ratio: 1.6 (ref 1.2–2.2)
Albumin: 4.7 g/dL (ref 3.8–4.9)
Alkaline Phosphatase: 94 IU/L (ref 44–121)
BUN/Creatinine Ratio: 13 (ref 9–20)
BUN: 16 mg/dL (ref 6–24)
Bilirubin Total: 0.4 mg/dL (ref 0.0–1.2)
CO2: 24 mmol/L (ref 20–29)
Calcium: 9.3 mg/dL (ref 8.7–10.2)
Chloride: 101 mmol/L (ref 96–106)
Creatinine, Ser: 1.24 mg/dL (ref 0.76–1.27)
Globulin, Total: 2.9 g/dL (ref 1.5–4.5)
Glucose: 86 mg/dL (ref 70–99)
Potassium: 5 mmol/L (ref 3.5–5.2)
Sodium: 140 mmol/L (ref 134–144)
Total Protein: 7.6 g/dL (ref 6.0–8.5)
eGFR: 69 mL/min/{1.73_m2} (ref >59)

## 2022-03-16 LAB — CBC
Hematocrit: 41.2 % (ref 37.5–51.0)
Hemoglobin: 13.3 g/dL (ref 13.0–17.7)
MCH: 27.7 pg (ref 26.6–33.0)
MCHC: 32.3 g/dL (ref 31.5–35.7)
MCV: 86 fL (ref 79–97)
Platelets: 315 10*3/uL (ref 150–450)
RBC: 4.81 x10E6/uL (ref 4.14–5.80)
RDW: 13.4 % (ref 11.6–15.4)
WBC: 7.5 10*3/uL (ref 3.4–10.8)

## 2022-03-16 LAB — LIPID PANEL
Chol/HDL Ratio: 3.7 ratio (ref 0.0–5.0)
Cholesterol, Total: 192 mg/dL (ref 100–199)
HDL: 52 mg/dL (ref >39)
LDL Chol Calc (NIH): 112 mg/dL — ABNORMAL HIGH (ref 0–99)
Triglycerides: 159 mg/dL — ABNORMAL HIGH (ref 0–149)
VLDL Cholesterol Cal: 28 mg/dL (ref 5–40)

## 2022-03-16 LAB — TSH: TSH: 5.58 u[IU]/mL — ABNORMAL HIGH (ref 0.450–4.500)

## 2022-03-16 NOTE — Progress Notes (Signed)
Thyroid function unchanged compare to 1 month ago, however TSH still borderline high, this will need to be assessed by his PCP

## 2022-03-16 NOTE — Progress Notes (Signed)
Kidney function and electrolyte stable. Red blood cell count improved, and now in the normal range. Liver function normal. Cholesterol remain uncontrolled. Please mail the patient his lab result. 2 options in this case, one option is for him to see our clinical pharmacist to discuss PCSK 9 inhibitors, other option is if the patient already moved to [facility], he can discuss with his new cardiologist at [facility] to start on the PCSK 9 inhibitor

## 2022-03-17 ENCOUNTER — Encounter: Payer: Self-pay | Admitting: *Deleted

## 2022-03-18 ENCOUNTER — Other Ambulatory Visit: Payer: Self-pay | Admitting: Physician Assistant

## 2022-03-27 MED ORDER — AMIODARONE HCL 200 MG PO TABS: 200.0000 mg | ORAL_TABLET | Freq: Every day | ORAL | 1 refills | Status: DC

## 2022-04-14 ENCOUNTER — Encounter: Payer: Self-pay | Admitting: *Deleted

## 2022-04-17 ENCOUNTER — Other Ambulatory Visit: Payer: Self-pay | Admitting: Physician Assistant

## 2022-04-19 ENCOUNTER — Other Ambulatory Visit: Payer: Self-pay | Admitting: Cardiology

## 2022-05-15 ENCOUNTER — Other Ambulatory Visit: Payer: Self-pay | Admitting: Cardiology

## 2022-05-15 ENCOUNTER — Other Ambulatory Visit: Payer: Self-pay | Admitting: Physician Assistant

## 2022-05-17 ENCOUNTER — Ambulatory Visit: Payer: 59 | Admitting: Cardiology

## 2022-11-20 IMAGING — DX DG CHEST 2V
2 series · 2 of 2 positions shown · non-contrast
Comparison: January 26, 2022

CLINICAL DATA: s/p CABG

EXAM:
CHEST - 2 VIEW

[dg chest 2 view (1 of 2)]
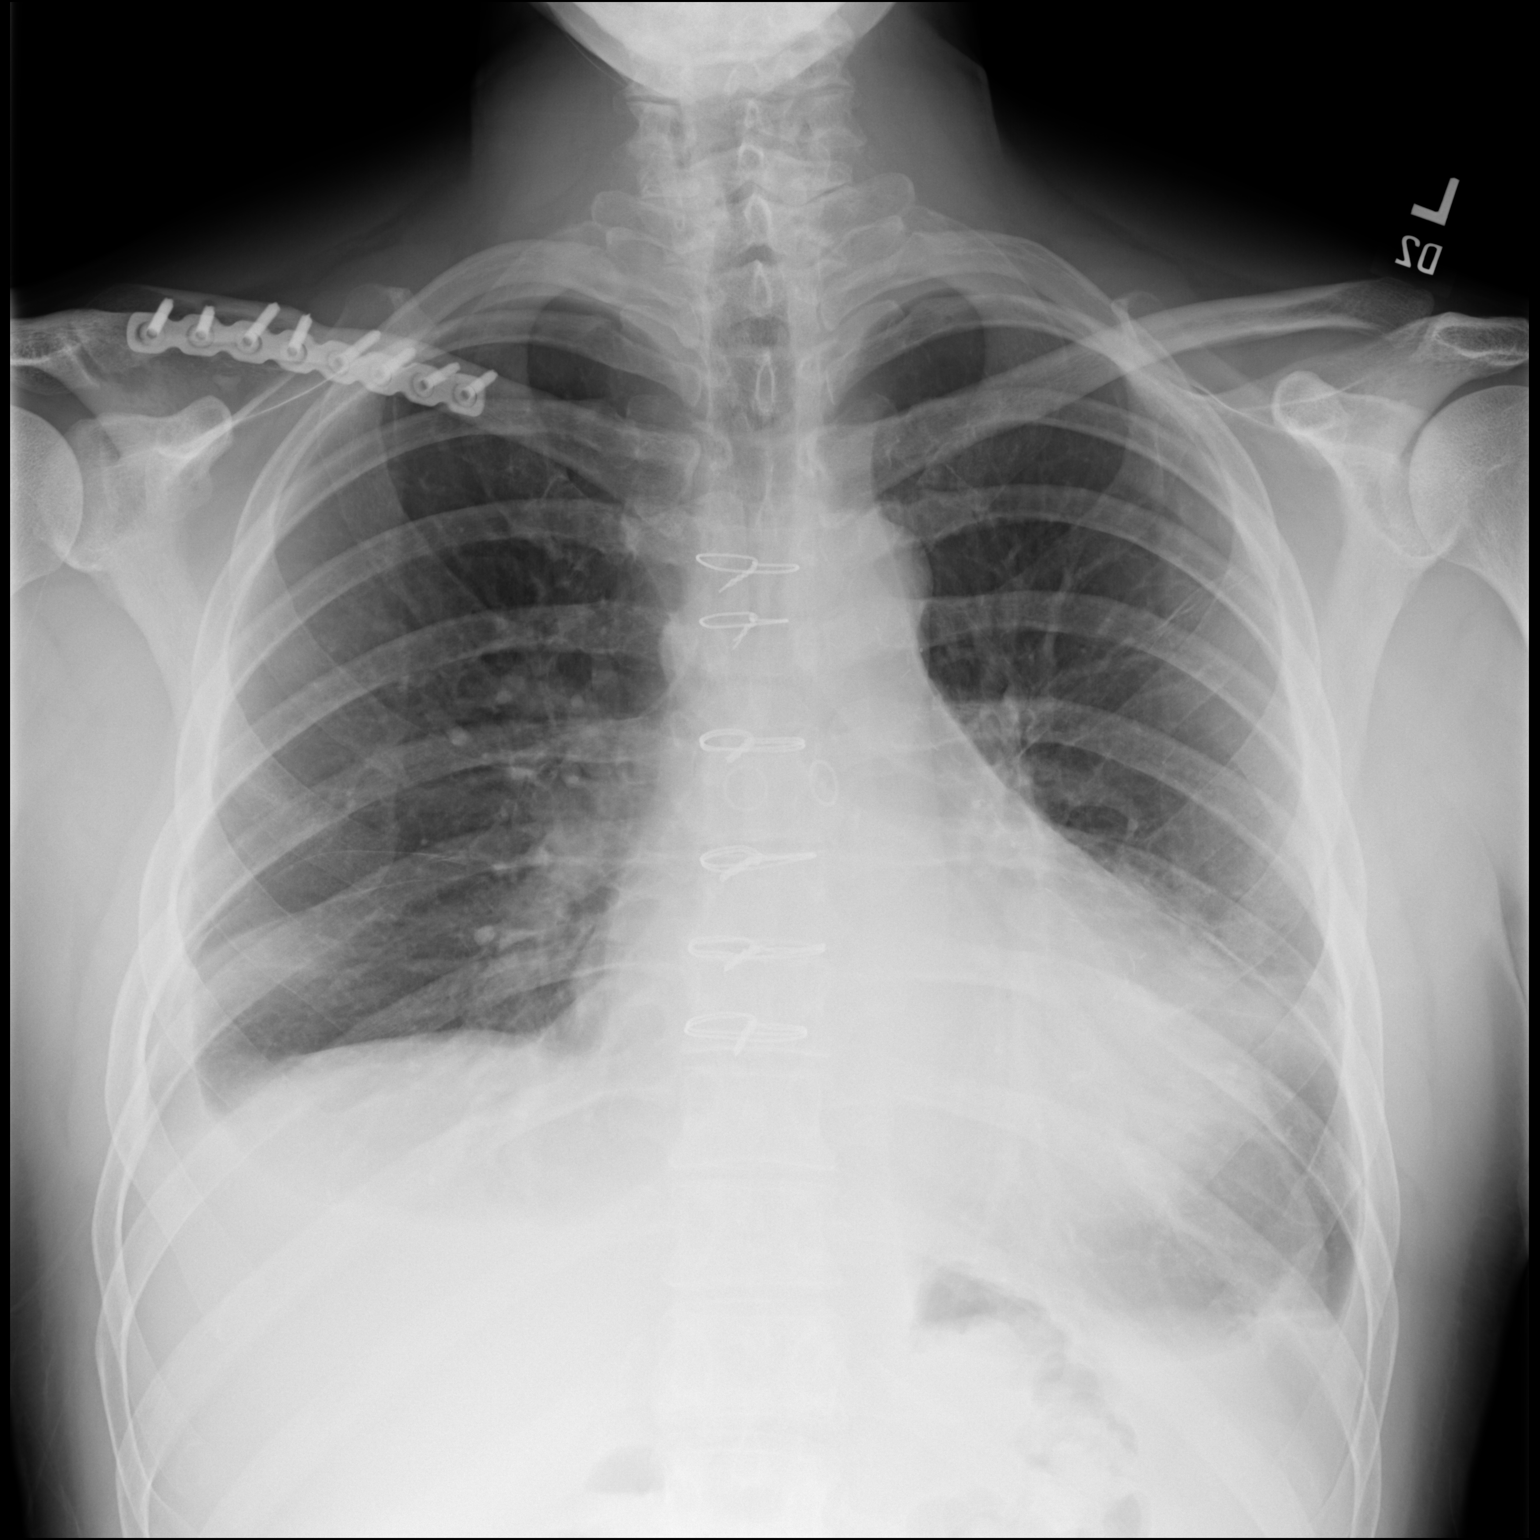

[dg chest 2 view (2 of 2)]
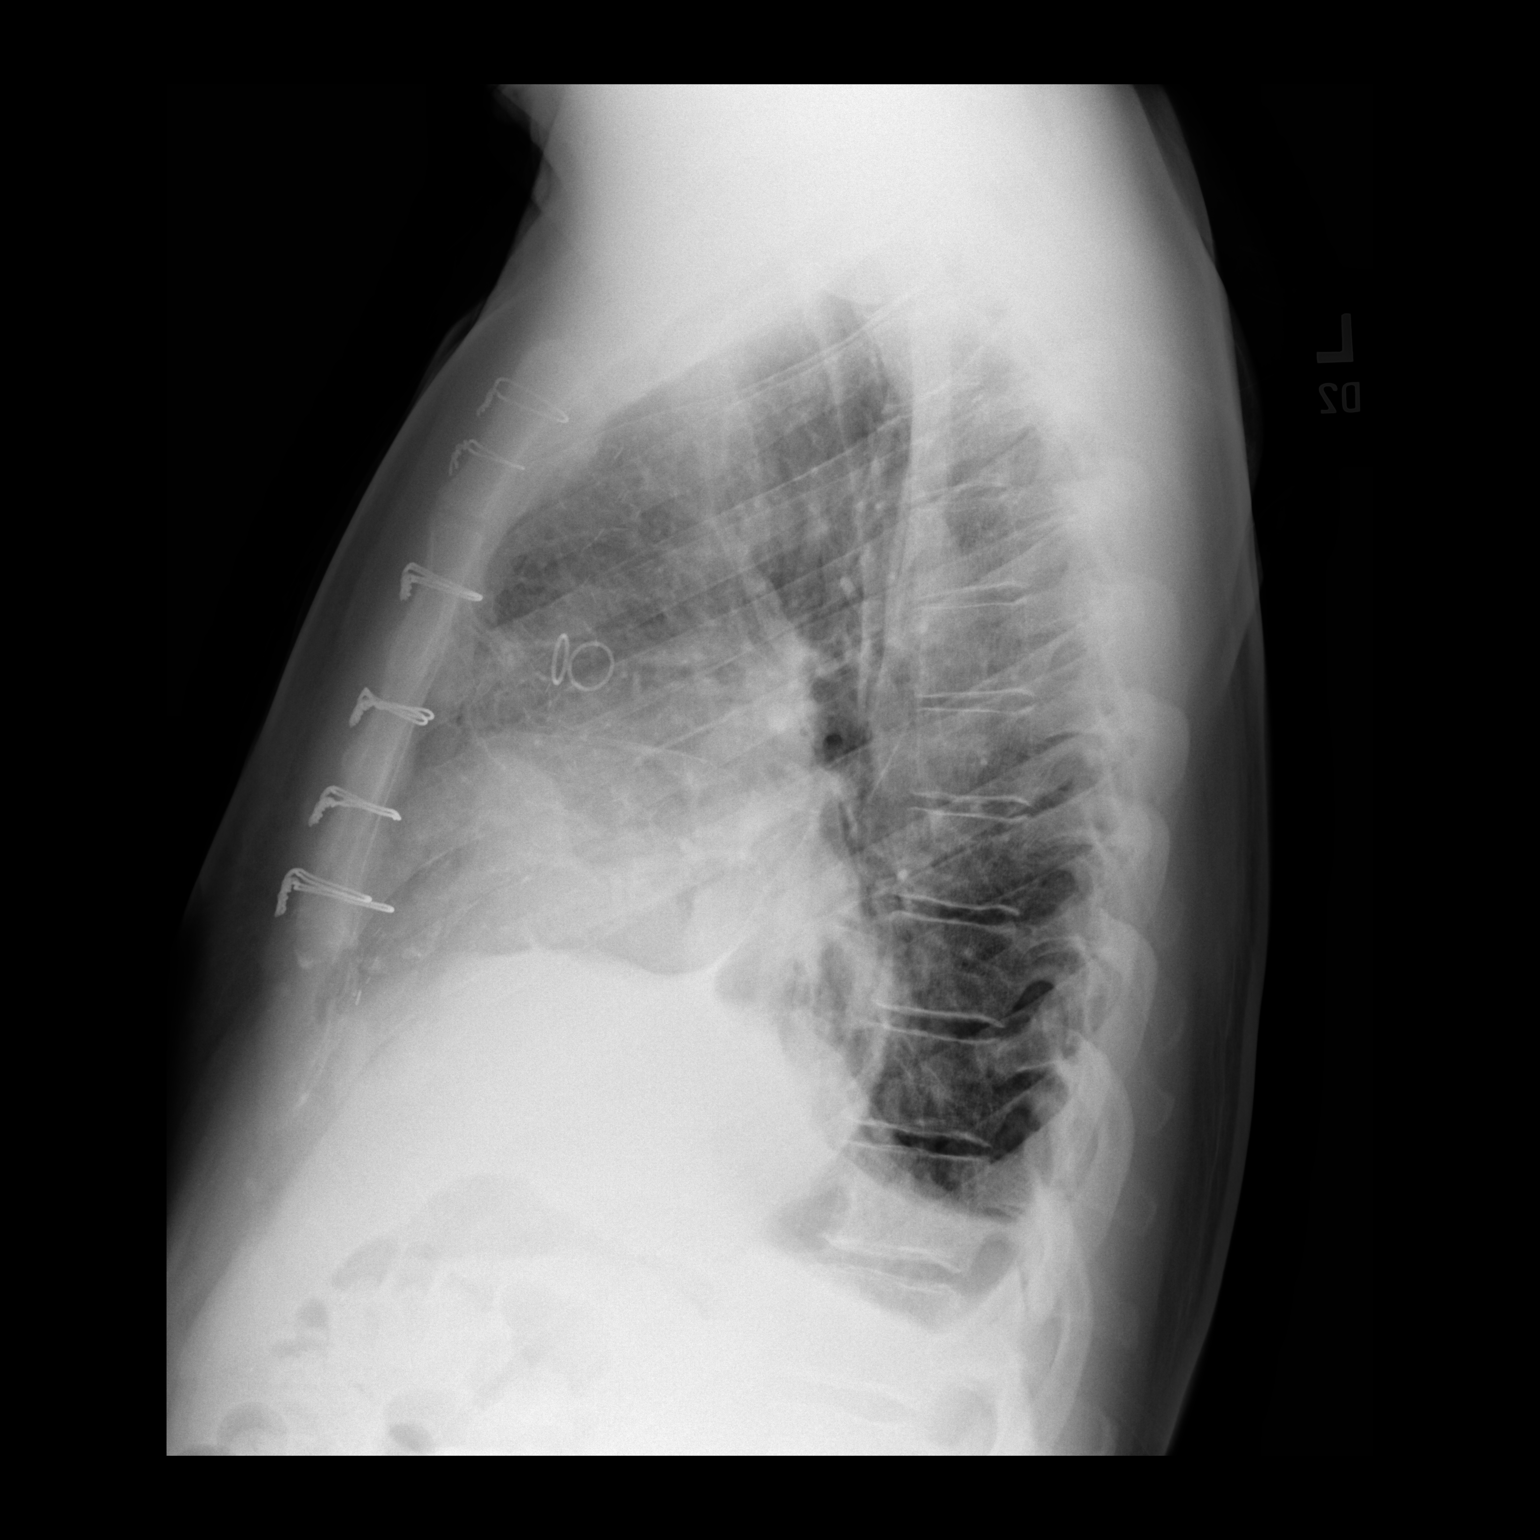

[2 of 2 positions shown; findings below may reference images not displayed]

FINDINGS: Stable appearance of the mediastinal silhouette. Status post median
sternotomy. Similar left lower lung retrocardiac opacity. Small
right pleural effusion. Right clavicle status post internal
fixation.
IMPRESSION: 1. Similar left lower lung/retrocardiac opacity, atelectasis versus
infiltrate.
2. Small right pleural effusion.

## 2022-12-13 IMAGING — DX DG CHEST 2V
2 series · 2 of 2 positions shown · non-contrast
Comparison: Previous studies including the examination of
01/31/2022

CLINICAL DATA: Status post coronary bypass surgery

EXAM:
CHEST - 2 VIEW

[dg chest 2 view (1 of 2)]
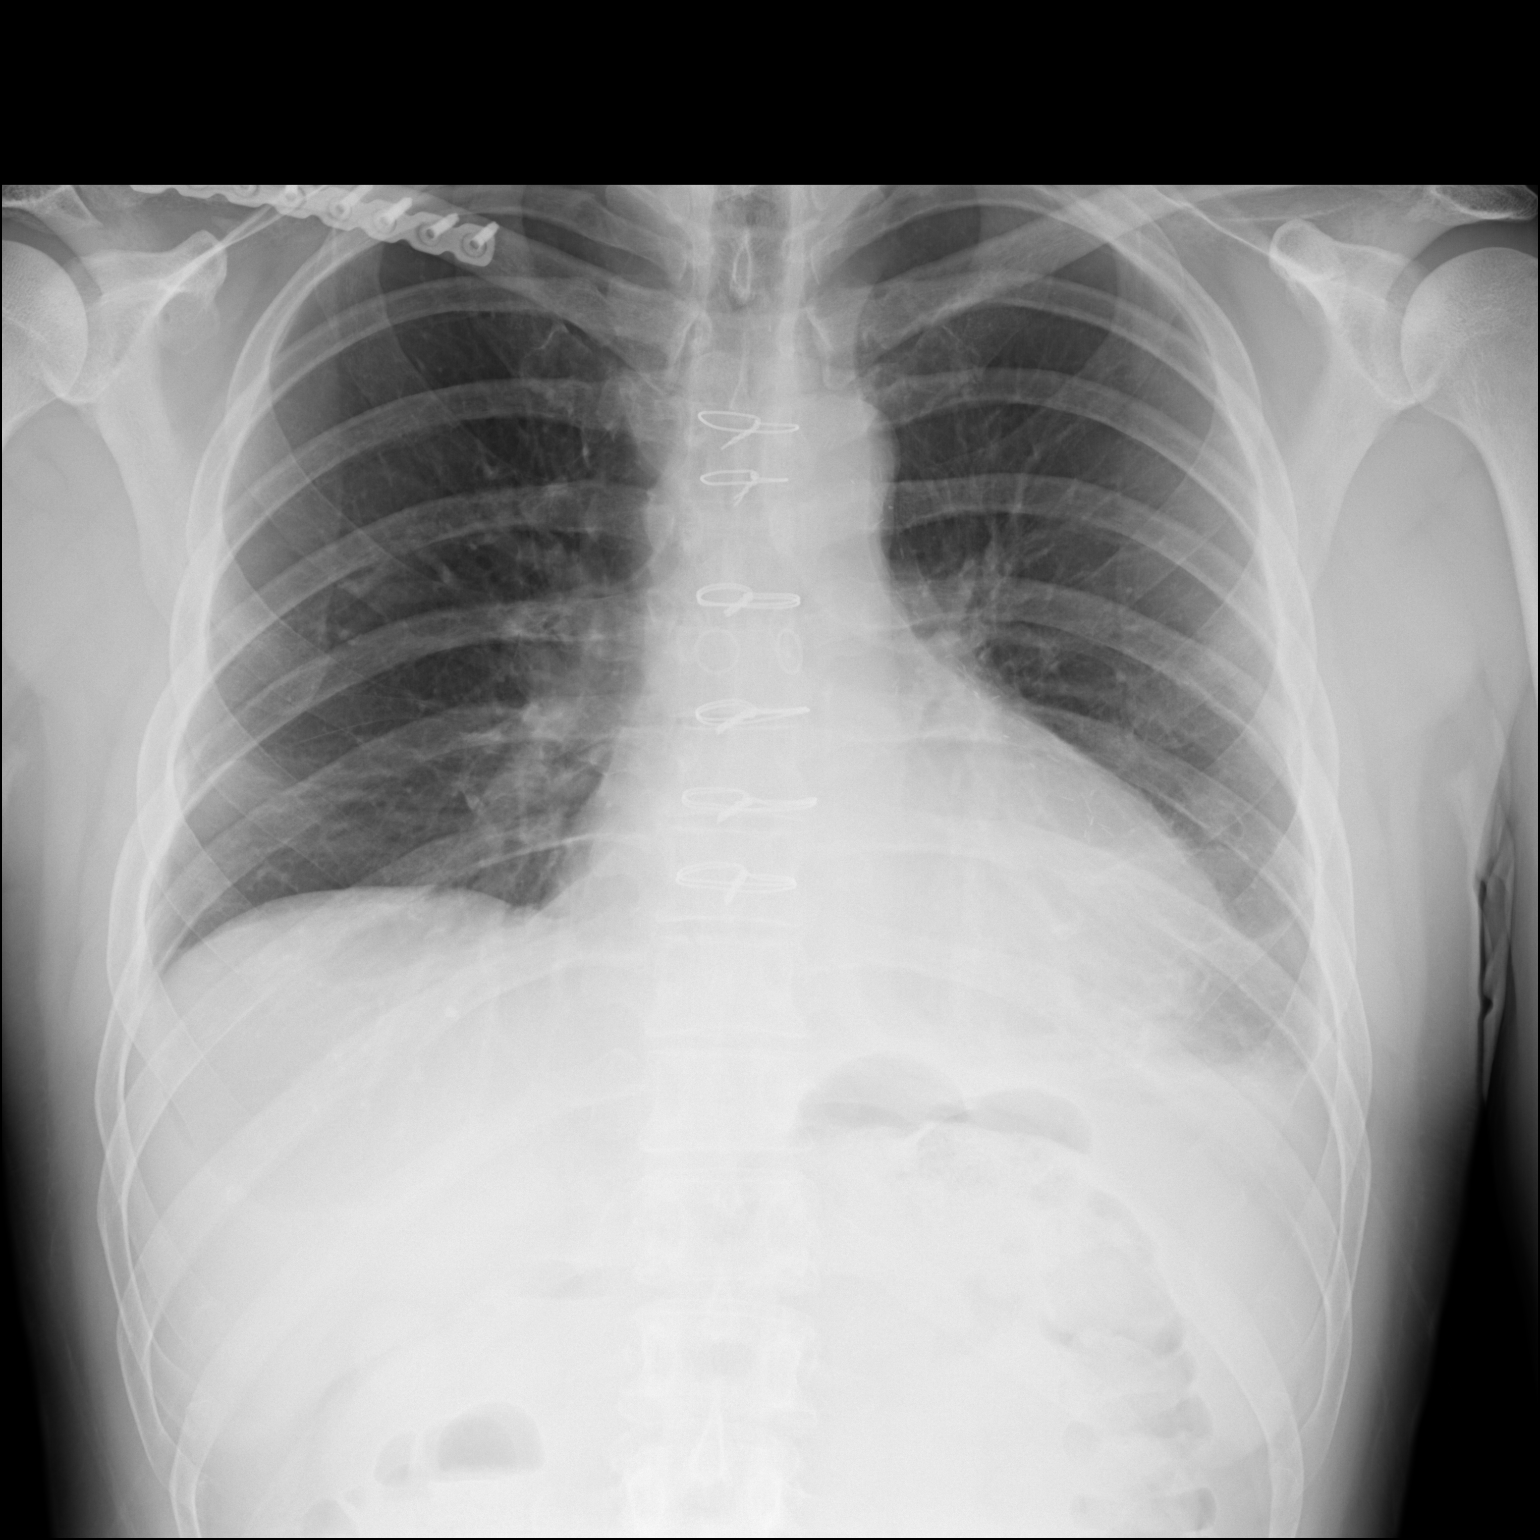

[dg chest 2 view (2 of 2)]
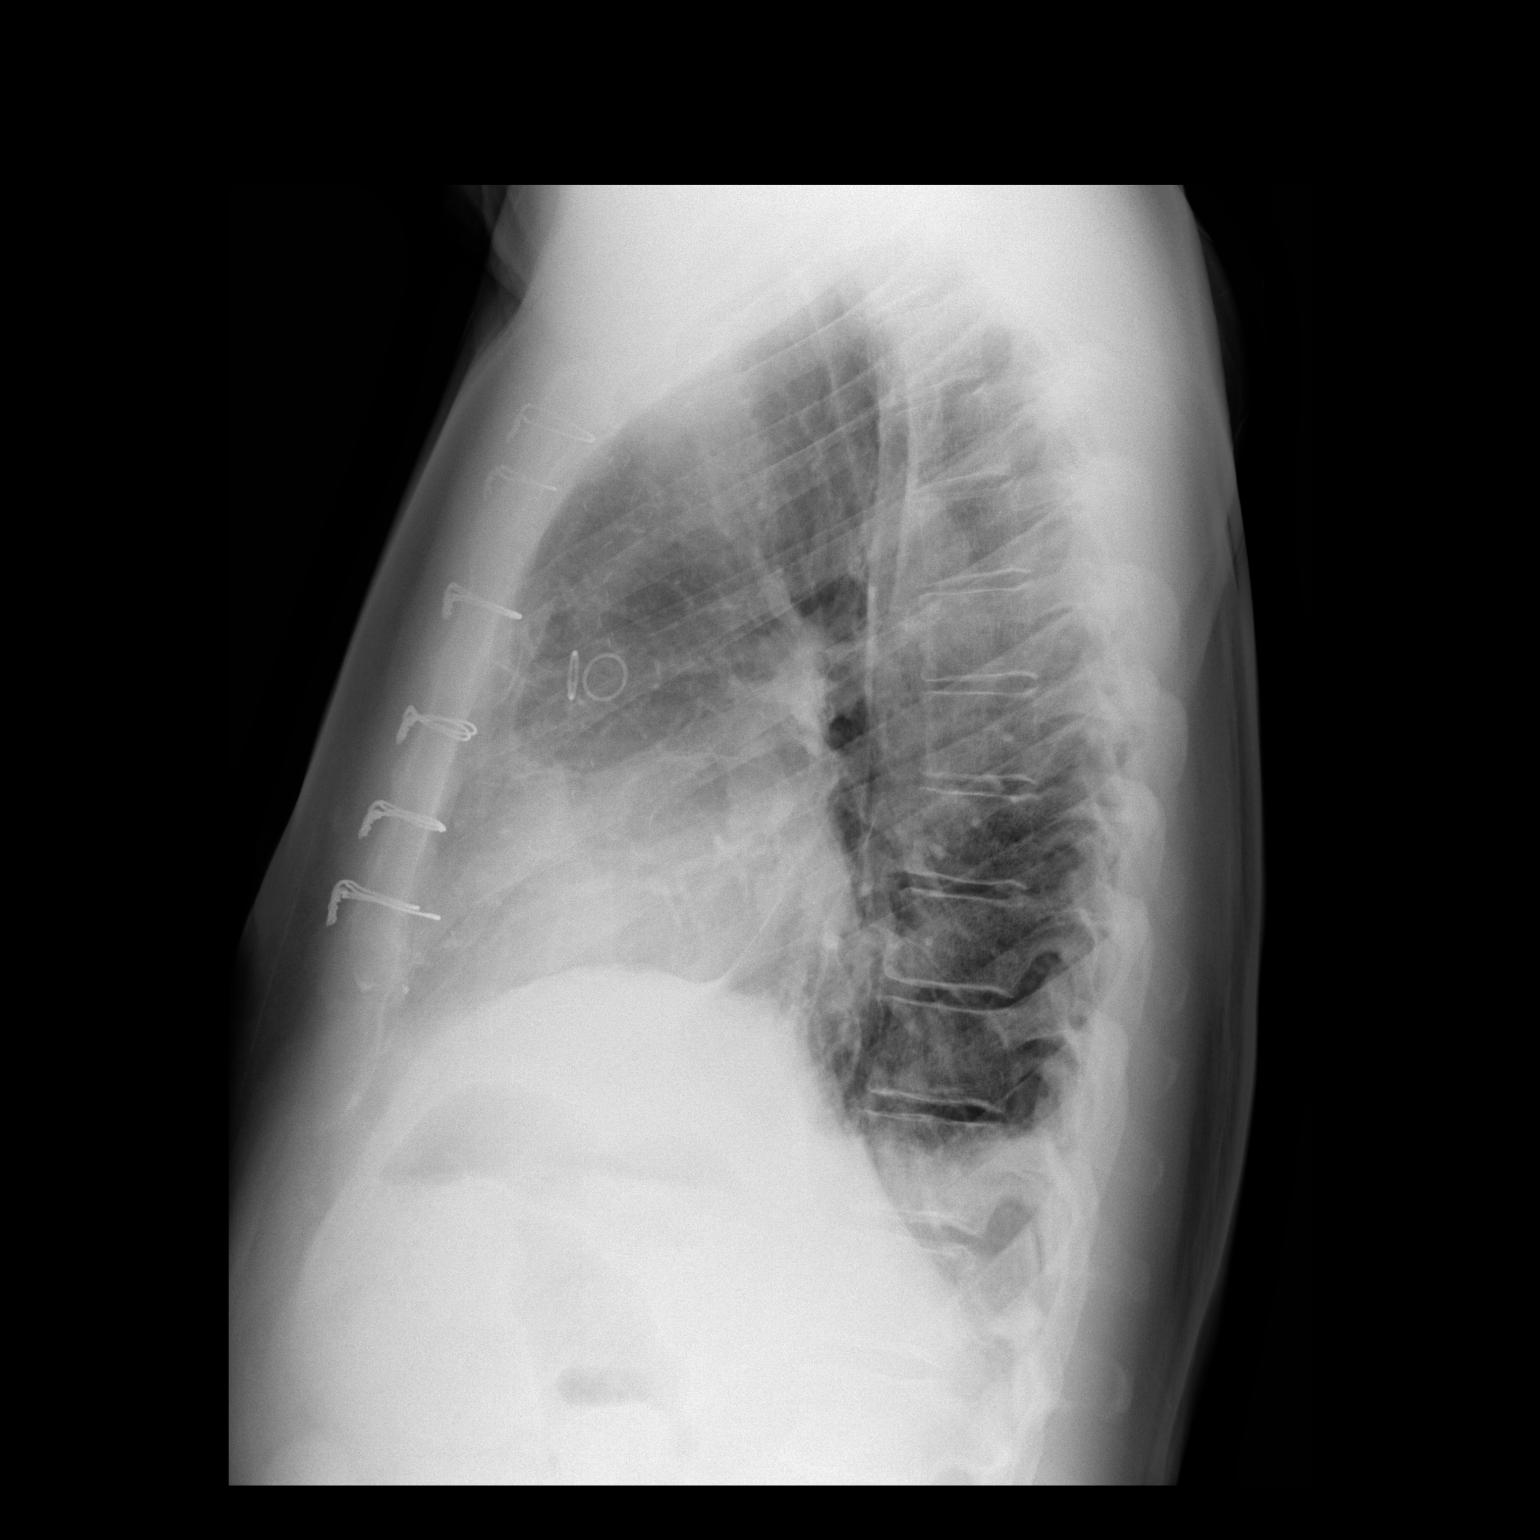

[2 of 2 positions shown; findings below may reference images not displayed]

FINDINGS: Transverse diameter of heart is increased. There is evidence of
coronary artery bypass surgery. There is previous internal fixation
of right clavicle. There is blunting of left lateral CP angle
suggesting small effusion. There is interval clearing of right
pleural effusion. There is interval improvement in aeration of
medial left lower lung fields suggesting resolving atelectasis.
There are no new focal pulmonary infiltrates. There is no
pneumothorax.
IMPRESSION: Cardiomegaly. Small left pleural effusion. There is improvement in
aeration of left lower lung fields suggesting resolving
atelectasis/pneumonia. There are no signs of pulmonary edema or new
focal infiltrates.

## 2022-12-19 ENCOUNTER — Other Ambulatory Visit: Payer: Self-pay | Admitting: Physician Assistant
